# Patient Record
Sex: Female | Born: 1995 | Race: Black or African American | Hispanic: No | Marital: Single | Smoking: Never smoker
Health system: Southern US, Community
[De-identification: ages and names within clinical notes are randomized; demographics above are authoritative.]

## PROBLEM LIST (undated history)

## (undated) DIAGNOSIS — O039 Complete or unspecified spontaneous abortion without complication: Secondary | ICD-10-CM

## (undated) DIAGNOSIS — J45909 Unspecified asthma, uncomplicated: Secondary | ICD-10-CM

---

## 2015-01-03 ENCOUNTER — Encounter (HOSPITAL_BASED_OUTPATIENT_CLINIC_OR_DEPARTMENT_OTHER): Payer: Self-pay | Admitting: *Deleted

## 2015-01-03 ENCOUNTER — Emergency Department (HOSPITAL_BASED_OUTPATIENT_CLINIC_OR_DEPARTMENT_OTHER)
Admission: EM | Admit: 2015-01-03 | Discharge: 2015-01-03 | Disposition: A | Discharge status: Home / Self Care | Payer: Medicaid Other | Attending: Emergency Medicine | Admitting: Emergency Medicine

## 2015-01-03 DIAGNOSIS — S3992XA Unspecified injury of lower back, initial encounter: Secondary | ICD-10-CM | POA: Diagnosis present

## 2015-01-03 DIAGNOSIS — Y998 Other external cause status: Secondary | ICD-10-CM | POA: Insufficient documentation

## 2015-01-03 DIAGNOSIS — Y9389 Activity, other specified: Secondary | ICD-10-CM | POA: Diagnosis not present

## 2015-01-03 DIAGNOSIS — J45909 Unspecified asthma, uncomplicated: Secondary | ICD-10-CM | POA: Diagnosis not present

## 2015-01-03 DIAGNOSIS — Y92410 Unspecified street and highway as the place of occurrence of the external cause: Secondary | ICD-10-CM | POA: Insufficient documentation

## 2015-01-03 HISTORY — DX: Unspecified asthma, uncomplicated: J45.909

## 2015-01-03 MED ORDER — ACETAMINOPHEN 500 MG PO TABS
1000.0000 mg | ORAL_TABLET | Freq: Once | ORAL | Status: AC
  Administered 2015-01-03: 1000 mg via ORAL
  Filled 2015-01-03: qty 2
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
## 2018-05-29 ENCOUNTER — Encounter (HOSPITAL_BASED_OUTPATIENT_CLINIC_OR_DEPARTMENT_OTHER): Payer: Self-pay | Admitting: Emergency Medicine

## 2018-05-29 ENCOUNTER — Emergency Department (HOSPITAL_BASED_OUTPATIENT_CLINIC_OR_DEPARTMENT_OTHER): Payer: Managed Care, Other (non HMO)

## 2018-05-29 ENCOUNTER — Other Ambulatory Visit: Payer: Self-pay

## 2018-05-29 ENCOUNTER — Emergency Department (HOSPITAL_BASED_OUTPATIENT_CLINIC_OR_DEPARTMENT_OTHER)
Admission: EM | Admit: 2018-05-29 | Discharge: 2018-05-29 | Disposition: A | Discharge status: Home / Self Care | Payer: Managed Care, Other (non HMO) | Attending: Emergency Medicine | Admitting: Emergency Medicine

## 2018-05-29 DIAGNOSIS — F17210 Nicotine dependence, cigarettes, uncomplicated: Secondary | ICD-10-CM | POA: Diagnosis not present

## 2018-05-29 DIAGNOSIS — R0789 Other chest pain: Secondary | ICD-10-CM

## 2018-05-29 DIAGNOSIS — J45909 Unspecified asthma, uncomplicated: Secondary | ICD-10-CM | POA: Insufficient documentation

## 2018-05-29 DIAGNOSIS — R079 Chest pain, unspecified: Secondary | ICD-10-CM | POA: Diagnosis present

## 2018-05-29 LAB — CBC
HEMATOCRIT: 40.2 % (ref 36.0–46.0)
Hemoglobin: 12.1 g/dL (ref 12.0–15.0)
MCH: 21.9 pg — AB (ref 26.0–34.0)
MCHC: 30.1 g/dL (ref 30.0–36.0)
MCV: 72.7 fL — AB (ref 80.0–100.0)
Platelets: 368 10*3/uL (ref 150–400)
RBC: 5.53 MIL/uL — ABNORMAL HIGH (ref 3.87–5.11)
RDW: 14.6 % (ref 11.5–15.5)
WBC: 4.6 10*3/uL (ref 4.0–10.5)
nRBC: 0 % (ref 0.0–0.2)

## 2018-05-29 LAB — BASIC METABOLIC PANEL
ANION GAP: 6 (ref 5–15)
BUN: 10 mg/dL (ref 6–20)
CALCIUM: 9 mg/dL (ref 8.9–10.3)
CO2: 24 mmol/L (ref 22–32)
CREATININE: 0.61 mg/dL (ref 0.44–1.00)
Chloride: 105 mmol/L (ref 98–111)
GFR calc Af Amer: >60 mL/min (ref >60)
Glucose, Bld: 92 mg/dL (ref 70–99)
Potassium: 3.7 mmol/L (ref 3.5–5.1)
Sodium: 135 mmol/L (ref 135–145)

## 2018-05-29 LAB — TROPONIN I: Troponin I: <0.03 ng/mL (ref <0.03)

## 2018-05-29 LAB — PREGNANCY, URINE: PREG TEST UR: NEGATIVE

## 2018-05-29 MED ORDER — IBUPROFEN 800 MG PO TABS
800.0000 mg | ORAL_TABLET | Freq: Once | ORAL | Status: AC
  Administered 2018-05-29: 800 mg via ORAL
  Filled 2018-05-29: qty 1

## 2018-05-29 NOTE — ED Triage Notes (Addendum)
Reports chest pain since last night. C/o shortness of breath, denies nausea.

## 2018-05-29 NOTE — ED Notes (Signed)
Pt verbalized understanding of dc instructions.

## 2018-05-29 NOTE — Discharge Instructions (Addendum)
°  Please take ibuprofen for chest wall pain.   Please call [place] [facility] to establish with a primary care doctor.    [address] [place] , [place]   Main [place]: [phone]

## 2018-05-29 NOTE — ED Provider Notes (Signed)
[facility] EMERGENCY DEPARTMENT Provider Note   CSN: [id number] Arrival date & time: 05/29/18  1012  History   Chief Complaint Chief Complaint  Patient presents with  . Chest Pain    HPI Anna Bright is a 22 y.o. female here with chest pain since last night. She reports it is a sharp pain and localizes it to the center of her chest. She felt short of breath last night but that has resolved. She took some tylenol with little relief. She felt unable to go to work last night. She also endorses anxiety. She reports "I had my first anxiety attack the other night for no reason". She denies SI/HI.   HPI  Past Medical History:  Diagnosis Date  . Asthma     There are no active problems to display for this patient.   History reviewed. No pertinent surgical history.   OB History   None      Home Medications    Prior to Admission medications   Not on File    Family History History reviewed. No pertinent family history.  Social History Social History   Tobacco Use  . Smoking status: Current Every Day Smoker    Types: Cigars  . Smokeless tobacco: Never Used  Substance Use Topics  . Alcohol use: No  . Drug use: No     Allergies   Patient has no known allergies.   Review of Systems Review of Systems  Constitutional: Negative for activity change, appetite change, chills and fever.  HENT: Negative for congestion, sinus pain and sore throat.   Eyes: Negative for photophobia and visual disturbance.  Respiratory: Negative for cough, shortness of breath and wheezing.   Cardiovascular: Positive for chest pain. Negative for palpitations.  Gastrointestinal: Negative for diarrhea, nausea and vomiting.  Genitourinary: Negative for dysuria.  Musculoskeletal: Negative for myalgias.  Skin: Negative for rash.  Neurological: Negative for weakness and headaches.  Psychiatric/Behavioral: The patient is nervous/anxious.      Physical Exam Updated Vital Signs BP  118/76 (BP Location: Left Arm)   Pulse 75   Temp 98.2 F (36.8 C) (Oral)   Resp 20   Ht 5\' 1"  (1.549 m)   Wt 72.6 kg   LMP 05/29/2018   SpO2 100%   BMI 30.23 kg/m   Physical Exam  Constitutional: She is oriented to person, place, and time. She appears well-developed and well-nourished.  Non-toxic appearance. She does not appear ill.  HENT:  Head: Normocephalic and atraumatic.  Eyes: Pupils are equal, round, and reactive to light. EOM are normal.  Neck: Normal range of motion. Neck supple.  Cardiovascular: Normal rate, regular rhythm and normal pulses.  Pulmonary/Chest: Effort normal and breath sounds normal. She exhibits tenderness.    Abdominal: Soft. There is no tenderness. There is no rebound and no guarding.  Musculoskeletal: Normal range of motion.  Neurological: She is alert and oriented to person, place, and time.  Skin: Skin is warm and dry. Capillary refill takes less than 2 seconds.  Psychiatric: Her mood appears anxious.  Nursing note and vitals reviewed.    ED Treatments / Results  Labs (all labs ordered are listed, but only abnormal results are displayed) Labs Reviewed  CBC - Abnormal; Notable for the following components:      Result Value   RBC 5.53 (*)    MCV 72.7 (*)    MCH 21.9 (*)    All other components within normal limits  BASIC METABOLIC PANEL  TROPONIN I  PREGNANCY, URINE    EKG None  Radiology Dg Chest 2 View  Result Date: 05/29/2018 CLINICAL DATA:  Chest pain. EXAM: CHEST - 2 VIEW COMPARISON:  None. FINDINGS: The heart size and mediastinal contours are within normal limits. Both lungs are clear. No pneumothorax or pleural effusion is noted. The visualized skeletal structures are unremarkable. IMPRESSION: No active cardiopulmonary disease. Electronically Signed   By: Lupita RaiderJames  Green Jr, M.D.   On: 05/29/2018 10:43    Procedures Procedures (including critical care time)  Medications Ordered in ED Medications  ibuprofen (ADVIL,MOTRIN)  tablet 800 mg (has no administration in time range)     Initial Impression / Assessment and Plan / ED Course  I have reviewed the triage vital signs and the nursing notes.  Pertinent labs & imaging results that were available during my care of the patient were reviewed by me and considered in my medical decision making (see chart for details).     Well appearing 22 year old with chest wall pain that began last night. Afebrile, normal vital signs. CXR negative. EKG NSR. CBC and BMP normal. Negative troponin. Pain is reproducible on exam. Patient also endorses anxiety. Given 800 mg ibuprofen. Reassurance provided. Will give information for her to establish with PCP to discuss anxiety. Stable for discharge home. Patient verbalized understanding and agreement with plan.   Final Clinical Impressions(s) / ED Diagnoses   Final diagnoses:  Chest wall pain    ED Discharge Orders    None      Dolores PattyAngela Mercades Bajaj, DO PGY-3, [facility] 05/29/2018 11:42 AM    Tillman Sersiccio, Talene Glastetter C, DO 05/29/18 1155    Little, Ambrose Finlandachel Morgan, MD 06/01/18 [phone]

## 2018-06-29 ENCOUNTER — Emergency Department (HOSPITAL_BASED_OUTPATIENT_CLINIC_OR_DEPARTMENT_OTHER)
Admission: EM | Admit: 2018-06-29 | Discharge: 2018-06-29 | Disposition: A | Discharge status: Home / Self Care | Payer: Managed Care, Other (non HMO) | Attending: Emergency Medicine | Admitting: Emergency Medicine

## 2018-06-29 ENCOUNTER — Other Ambulatory Visit: Payer: Self-pay

## 2018-06-29 ENCOUNTER — Emergency Department (HOSPITAL_BASED_OUTPATIENT_CLINIC_OR_DEPARTMENT_OTHER): Payer: Managed Care, Other (non HMO)

## 2018-06-29 ENCOUNTER — Encounter (HOSPITAL_BASED_OUTPATIENT_CLINIC_OR_DEPARTMENT_OTHER): Payer: Self-pay | Admitting: Emergency Medicine

## 2018-06-29 DIAGNOSIS — R1011 Right upper quadrant pain: Secondary | ICD-10-CM | POA: Diagnosis present

## 2018-06-29 DIAGNOSIS — J45909 Unspecified asthma, uncomplicated: Secondary | ICD-10-CM | POA: Insufficient documentation

## 2018-06-29 DIAGNOSIS — K8020 Calculus of gallbladder without cholecystitis without obstruction: Secondary | ICD-10-CM | POA: Insufficient documentation

## 2018-06-29 DIAGNOSIS — F17290 Nicotine dependence, other tobacco product, uncomplicated: Secondary | ICD-10-CM | POA: Insufficient documentation

## 2018-06-29 DIAGNOSIS — R109 Unspecified abdominal pain: Secondary | ICD-10-CM

## 2018-06-29 LAB — CBC
HCT: 37.9 % (ref 36.0–46.0)
Hemoglobin: 11.5 g/dL — ABNORMAL LOW (ref 12.0–15.0)
MCH: 22.2 pg — AB (ref 26.0–34.0)
MCHC: 30.3 g/dL (ref 30.0–36.0)
MCV: 73 fL — ABNORMAL LOW (ref 80.0–100.0)
Platelets: 318 10*3/uL (ref 150–400)
RBC: 5.19 MIL/uL — ABNORMAL HIGH (ref 3.87–5.11)
RDW: 14.6 % (ref 11.5–15.5)
WBC: 5.5 10*3/uL (ref 4.0–10.5)
nRBC: 0 % (ref 0.0–0.2)

## 2018-06-29 LAB — URINALYSIS, MICROSCOPIC (REFLEX): WBC, UA: NONE SEEN WBC/hpf (ref 0–5)

## 2018-06-29 LAB — COMPREHENSIVE METABOLIC PANEL
ALK PHOS: 38 U/L (ref 38–126)
ALT: 17 U/L (ref 0–44)
AST: 30 U/L (ref 15–41)
Albumin: 3.4 g/dL — ABNORMAL LOW (ref 3.5–5.0)
Anion gap: 7 (ref 5–15)
BUN: 9 mg/dL (ref 6–20)
CALCIUM: 8.8 mg/dL — AB (ref 8.9–10.3)
CO2: 22 mmol/L (ref 22–32)
CREATININE: 0.53 mg/dL (ref 0.44–1.00)
Chloride: 106 mmol/L (ref 98–111)
GFR calc Af Amer: >60 mL/min (ref >60)
Glucose, Bld: 102 mg/dL — ABNORMAL HIGH (ref 70–99)
Potassium: 3.3 mmol/L — ABNORMAL LOW (ref 3.5–5.1)
Sodium: 135 mmol/L (ref 135–145)
TOTAL PROTEIN: 7.4 g/dL (ref 6.5–8.1)
Total Bilirubin: 0.4 mg/dL (ref 0.3–1.2)

## 2018-06-29 LAB — URINALYSIS, ROUTINE W REFLEX MICROSCOPIC
BILIRUBIN URINE: NEGATIVE
Glucose, UA: NEGATIVE mg/dL
Ketones, ur: NEGATIVE mg/dL
Leukocytes, UA: NEGATIVE
Nitrite: NEGATIVE
PROTEIN: NEGATIVE mg/dL
Specific Gravity, Urine: >1.03 — ABNORMAL HIGH (ref 1.005–1.030)
pH: 5.5 (ref 5.0–8.0)

## 2018-06-29 LAB — LIPASE, BLOOD: Lipase: 26 U/L (ref 11–51)

## 2018-06-29 LAB — PREGNANCY, URINE: PREG TEST UR: NEGATIVE

## 2018-06-29 MED ORDER — ONDANSETRON HCL 4 MG/2ML IJ SOLN
4.0000 mg | Freq: Once | INTRAMUSCULAR | Status: AC
  Administered 2018-06-29: 4 mg via INTRAVENOUS
  Filled 2018-06-29: qty 2

## 2018-06-29 MED ORDER — ONDANSETRON 4 MG PO TBDP: 4.0000 mg | ORAL_TABLET | Freq: Three times a day (TID) | ORAL | 0 refills | Status: DC | PRN

## 2018-06-29 MED ORDER — OXYCODONE-ACETAMINOPHEN 5-325 MG PO TABS: 1.0000 | ORAL_TABLET | ORAL | 0 refills | Status: AC | PRN

## 2018-06-29 MED ORDER — OXYCODONE-ACETAMINOPHEN 5-325 MG PO TABS
2.0000 | ORAL_TABLET | Freq: Once | ORAL | Status: AC
  Administered 2018-06-29: 2 via ORAL
  Filled 2018-06-29: qty 2

## 2018-06-29 MED ORDER — MORPHINE SULFATE (PF) 4 MG/ML IV SOLN
4.0000 mg | Freq: Once | INTRAVENOUS | Status: AC
  Administered 2018-06-29: 4 mg via INTRAVENOUS
  Filled 2018-06-29: qty 1

## 2018-06-29 NOTE — Discharge Instructions (Addendum)
EAT SMALL PORTIONS OF FOOD AND AVOID GREASY OR FATTY FOODS. A LOW FAT DIET IS VERY IMPORTANT TO AVOID GALLBLADDER PAIN. CONTACT THE SURGERY CLINIC FOR A NEXT-AVAILABLE APPOINTMENT. IF YOUR PAIN AND VOMITING COMES BACK AND GETS WORSE, OR IF YOU DEVELOP FEVER, GO DIRECTLY TO [place] OR [place] ER.
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
## 2018-06-29 NOTE — ED Notes (Signed)
PO ginger ale given

## 2018-06-29 NOTE — ED Provider Notes (Signed)
[facility] EMERGENCY DEPARTMENT Provider Note   CSN: [id number] Arrival date & time: 06/29/18  1730     History   Chief Complaint Chief Complaint  Patient presents with  . Abdominal Pain    HPI Anna Bright is a 23 y.o. female.  23yo F w/ h/o asthma who p/w abdominal pain. Pt states that she has intermittently had problems with right upper abdominal pain especially after she eats but the pain has always gone away.  This morning she began having severe right upper abdominal pain that radiates to her back.  She took Tylenol and it resolved.  This evening she ate a bowl of cereal and then the abdominal pain returned.  Currently it is severe, squeezing, and constant.  She denies any hematuria but notes that she is currently menstruating.  No vaginal discharge.  She usually has no nausea or vomiting with this pain but tonight she had one episode of vomiting just prior to arrival.  No diarrhea or blood in her stool.  No URI symptoms or fevers.  The history is provided by the patient.  Abdominal Pain    Past Medical History:  Diagnosis Date  . Asthma     There are no active problems to display for this patient.   History reviewed. No pertinent surgical history.   OB History   No obstetric history on file.      Home Medications    Prior to Admission medications   Medication Sig Start Date End Date Taking? Authorizing Provider  ondansetron (ZOFRAN ODT) 4 MG disintegrating tablet Take 1 tablet (4 mg total) by mouth every 8 (eight) hours as needed for nausea or vomiting. 06/29/18   Little, Ambrose Finland, MD  oxyCODONE-acetaminophen (PERCOCET) 5-325 MG tablet Take 1 tablet by mouth every 4 (four) hours as needed for up to 5 days for severe pain. 06/29/18 07/04/18  Little, Ambrose Finland, MD    Family History History reviewed. No pertinent family history.  Social History Social History   Tobacco Use  . Smoking status: Current Every Day Smoker    Types: Cigars  .  Smokeless tobacco: Never Used  Substance Use Topics  . Alcohol use: No  . Drug use: No     Allergies   Patient has no known allergies.   Review of Systems Review of Systems  Gastrointestinal: Positive for abdominal pain.  All other systems reviewed and are negative except that which was mentioned in HPI    Physical Exam Updated Vital Signs BP 120/68 (BP Location: Right Arm)   Pulse 78   Temp 98.5 F (36.9 C) (Oral)   Resp 16   Ht 5\' 1"  (1.549 m)   Wt 68 kg   LMP 06/26/2018   SpO2 100%   BMI 28.34 kg/m   Physical Exam Vitals signs and nursing note reviewed.  Constitutional:      General: She is not in acute distress.    Appearance: She is well-developed.     Comments: uncomfortable  HENT:     Head: Normocephalic and atraumatic.  Eyes:     Conjunctiva/sclera: Conjunctivae normal.     Pupils: Pupils are equal, round, and reactive to light.  Neck:     Musculoskeletal: Neck supple.  Cardiovascular:     Rate and Rhythm: Normal rate and regular rhythm.     Heart sounds: Normal heart sounds. No murmur.  Pulmonary:     Effort: Pulmonary effort is normal.     Breath sounds: Normal breath sounds.  Abdominal:     General: Bowel sounds are normal. There is no distension.     Palpations: Abdomen is soft.     Tenderness: There is abdominal tenderness in the right upper quadrant. There is no guarding or rebound. Positive signs include Murphy's sign.  Skin:    General: Skin is warm and dry.  Neurological:     Mental Status: She is alert and oriented to person, place, and time.     Comments: Fluent speech  Psychiatric:        Judgment: Judgment normal.      ED Treatments / Results  Labs (all labs ordered are listed, but only abnormal results are displayed) Labs Reviewed  COMPREHENSIVE METABOLIC PANEL - Abnormal; Notable for the following components:      Result Value   Potassium 3.3 (*)    Glucose, Bld 102 (*)    Calcium 8.8 (*)    Albumin 3.4 (*)    All  other components within normal limits  CBC - Abnormal; Notable for the following components:   RBC 5.19 (*)    Hemoglobin 11.5 (*)    MCV 73.0 (*)    MCH 22.2 (*)    All other components within normal limits  URINALYSIS, ROUTINE W REFLEX MICROSCOPIC - Abnormal; Notable for the following components:   Specific Gravity, Urine >1.030 (*)    Hgb urine dipstick TRACE (*)    All other components within normal limits  URINALYSIS, MICROSCOPIC (REFLEX) - Abnormal; Notable for the following components:   Bacteria, UA RARE (*)    All other components within normal limits  LIPASE, BLOOD  PREGNANCY, URINE    EKG None  Radiology [place] Abdomen Limited Ruq  Result Date: 06/29/2018 CLINICAL DATA:  Acute onset of severe right upper quadrant pain this morning. Vomiting. Positive Murphy's sign. EXAM: ULTRASOUND ABDOMEN LIMITED RIGHT UPPER QUADRANT COMPARISON:  None. FINDINGS: Gallbladder: Multiple tiny less than 1 cm gallstones are seen. No evidence of gallbladder wall thickening or pericholecystic fluid. Common bile duct: Diameter: 5 mm, within normal limits. Liver: No focal lesion identified. Within normal limits in parenchymal echogenicity. Portal vein is patent on color Doppler imaging with normal direction of blood flow towards the liver. IMPRESSION: Cholelithiasis, without definite sonographic findings of cholecystitis or biliary ductal dilatation. Electronically Signed   By: Myles RosenthalJohn  Stahl M.D.   On: 06/29/2018 18:51    Procedures Procedures (including critical care time)  Medications Ordered in ED Medications  ondansetron (ZOFRAN) injection 4 mg (4 mg Intravenous Given 06/29/18 1756)  morphine 4 MG/ML injection 4 mg (4 mg Intravenous Given 06/29/18 1756)  oxyCODONE-acetaminophen (PERCOCET/ROXICET) 5-325 MG per tablet 2 tablet (2 tablets Oral Given 06/29/18 2013)     Initial Impression / Assessment and Plan / ED Course  I have reviewed the triage vital signs and the nursing notes.  Pertinent labs  & imaging results that were available during my care of the patient were reviewed by me and considered in my medical decision making (see chart for details).    Non-toxic on exam, reassuring VS. No RLQ tenderness on exam but she did have RUQ pain which raises suspicion for gallbladder pathology. Obtained RUQ US and labs.   WBC normal, Hgb 11.5, LFTS and bili normal, lipase normal.   US shows cholelithiasis without evidence of cholecystitis. Pt improved after percocet and drinking liquids on reassessment.  She wants to go home. She understands importance of f/u with gen surgery and I have extensively reviewed return precautions. I have  instructed her to report to [facility] or [facility] ED if her symptoms worsen or she fails outpatient management. Counseled on dietary changes.  Final Clinical Impressions(s) / ED Diagnoses   Final diagnoses:  Symptomatic cholelithiasis    ED Discharge Orders         Ordered    oxyCODONE-acetaminophen (PERCOCET) 5-325 MG tablet  Every 4 hours PRN     06/29/18 2126    ondansetron (ZOFRAN ODT) 4 MG disintegrating tablet  Every 8 hours PRN     06/29/18 2126           Little, Ambrose Finland, MD 06/30/18 [phone]

## 2018-06-29 NOTE — ED Triage Notes (Signed)
Reports RLQ abdominal and right flank pain which began this morning.  Denies dysuria.  Reports 1 episode of vomiting.

## 2019-03-13 ENCOUNTER — Emergency Department (HOSPITAL_BASED_OUTPATIENT_CLINIC_OR_DEPARTMENT_OTHER)
Admission: EM | Admit: 2019-03-13 | Discharge: 2019-03-13 | Disposition: A | Discharge status: Home / Self Care | Payer: Managed Care, Other (non HMO) | Attending: Emergency Medicine | Admitting: Emergency Medicine

## 2019-03-13 ENCOUNTER — Encounter (HOSPITAL_BASED_OUTPATIENT_CLINIC_OR_DEPARTMENT_OTHER): Payer: Self-pay | Admitting: Emergency Medicine

## 2019-03-13 ENCOUNTER — Other Ambulatory Visit: Payer: Self-pay

## 2019-03-13 ENCOUNTER — Emergency Department (HOSPITAL_BASED_OUTPATIENT_CLINIC_OR_DEPARTMENT_OTHER): Payer: Managed Care, Other (non HMO)

## 2019-03-13 DIAGNOSIS — K8050 Calculus of bile duct without cholangitis or cholecystitis without obstruction: Secondary | ICD-10-CM | POA: Insufficient documentation

## 2019-03-13 DIAGNOSIS — R0789 Other chest pain: Secondary | ICD-10-CM | POA: Diagnosis present

## 2019-03-13 DIAGNOSIS — J45909 Unspecified asthma, uncomplicated: Secondary | ICD-10-CM | POA: Insufficient documentation

## 2019-03-13 DIAGNOSIS — R1011 Right upper quadrant pain: Secondary | ICD-10-CM

## 2019-03-13 LAB — CBC WITH DIFFERENTIAL/PLATELET
Abs Immature Granulocytes: 0.01 10*3/uL (ref 0.00–0.07)
Basophils Absolute: 0 10*3/uL (ref 0.0–0.1)
Basophils Relative: 0 %
Eosinophils Absolute: 0.1 10*3/uL (ref 0.0–0.5)
Eosinophils Relative: 2 %
HCT: 36.8 % (ref 36.0–46.0)
Hemoglobin: 11.2 g/dL — ABNORMAL LOW (ref 12.0–15.0)
Immature Granulocytes: 0 %
Lymphocytes Relative: 38 %
Lymphs Abs: 2.2 10*3/uL (ref 0.7–4.0)
MCH: 21.9 pg — ABNORMAL LOW (ref 26.0–34.0)
MCHC: 30.4 g/dL (ref 30.0–36.0)
MCV: 72 fL — ABNORMAL LOW (ref 80.0–100.0)
Monocytes Absolute: 0.4 10*3/uL (ref 0.1–1.0)
Monocytes Relative: 7 %
Neutro Abs: 3.1 10*3/uL (ref 1.7–7.7)
Neutrophils Relative %: 53 %
Platelets: 336 10*3/uL (ref 150–400)
RBC: 5.11 MIL/uL (ref 3.87–5.11)
RDW: 15.4 % (ref 11.5–15.5)
WBC: 5.8 10*3/uL (ref 4.0–10.5)
nRBC: 0 % (ref 0.0–0.2)

## 2019-03-13 LAB — COMPREHENSIVE METABOLIC PANEL
ALT: 27 U/L (ref 0–44)
AST: 28 U/L (ref 15–41)
Albumin: 3.3 g/dL — ABNORMAL LOW (ref 3.5–5.0)
Alkaline Phosphatase: 50 U/L (ref 38–126)
Anion gap: 8 (ref 5–15)
BUN: 13 mg/dL (ref 6–20)
CO2: 23 mmol/L (ref 22–32)
Calcium: 8.6 mg/dL — ABNORMAL LOW (ref 8.9–10.3)
Chloride: 105 mmol/L (ref 98–111)
Creatinine, Ser: 0.56 mg/dL (ref 0.44–1.00)
GFR calc Af Amer: >60 mL/min (ref >60)
GFR calc non Af Amer: >60 mL/min (ref >60)
Glucose, Bld: 98 mg/dL (ref 70–99)
Potassium: 4.1 mmol/L (ref 3.5–5.1)
Sodium: 136 mmol/L (ref 135–145)
Total Bilirubin: 0.5 mg/dL (ref 0.3–1.2)
Total Protein: 7.1 g/dL (ref 6.5–8.1)

## 2019-03-13 MED ORDER — KETOROLAC TROMETHAMINE 15 MG/ML IJ SOLN
30.0000 mg | Freq: Once | INTRAMUSCULAR | Status: AC
  Administered 2019-03-13: 30 mg via INTRAVENOUS
  Filled 2019-03-13: qty 2

## 2019-03-13 NOTE — Discharge Instructions (Signed)
You were seen in the emergency department for right-sided chest pain.  You had blood work EKG and a ultrasound.  This pain is likely due to your gallstones and he will need to follow-up with a general surgeon to discuss possible surgery.  You should use Tylenol and ibuprofen for pain.  Avoid fatty foods.  Return if any fever or unrelenting pain.

## 2019-03-13 NOTE — ED Notes (Signed)
US at bedside
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
## 2019-03-13 NOTE — ED Provider Notes (Signed)
[facility] EMERGENCY DEPARTMENT Provider Note   CSN: [id number] Arrival date & time: 03/13/19  0488     History   Chief Complaint Chief Complaint  Patient presents with  . Chest Pain    HPI Anna Bright is a 23 y.o. female.  23 year old female with right-sided chest pain that started around 2 AM waking her up.  She feels it under her right breast and it feels tight.  Moderate intensity.  She said she has had this discomfort on and off for a few years and nobody is ever figured out what it was.  No prior surgical history.  When I asked her if anybody ever tested her for gallstone she said yes that is what they told her the last time.     The history is provided by the patient.  Chest Pain Pain location:  R chest Pain quality: tightness   Pain radiates to:  Does not radiate Pain severity:  Moderate Onset quality:  Sudden Duration:  7 hours Timing:  Constant Progression:  Unchanged Chronicity:  Recurrent Relieved by:  None tried Worsened by:  Nothing Ineffective treatments:  None tried Associated symptoms: nausea and vomiting   Associated symptoms: no abdominal pain, no cough, no fever, no headache, no heartburn and no shortness of breath   Risk factors: no coronary artery disease, no diabetes mellitus, no high cholesterol, no hypertension and not pregnant     Past Medical History:  Diagnosis Date  . Asthma     There are no active problems to display for this patient.   History reviewed. No pertinent surgical history.   OB History   No obstetric history on file.      Home Medications    Prior to Admission medications   Medication Sig Start Date End Date Taking? Authorizing Provider  ondansetron (ZOFRAN ODT) 4 MG disintegrating tablet Take 1 tablet (4 mg total) by mouth every 8 (eight) hours as needed for nausea or vomiting. 06/29/18   Little, Ambrose Finland, MD    Family History No family history on file.  Social History Social History    Tobacco Use  . Smoking status: Never Smoker  . Smokeless tobacco: Never Used  Substance Use Topics  . Alcohol use: No  . Drug use: No     Allergies   Patient has no known allergies.   Review of Systems Review of Systems  Constitutional: Negative for fever.  HENT: Negative for sore throat.   Eyes: Negative for visual disturbance.  Respiratory: Negative for cough and shortness of breath.   Cardiovascular: Positive for chest pain.  Gastrointestinal: Positive for nausea and vomiting. Negative for abdominal pain and heartburn.  Genitourinary: Negative for dysuria.  Musculoskeletal: Negative for neck pain.  Skin: Negative for rash.  Neurological: Negative for headaches.     Physical Exam Updated Vital Signs BP 118/73   Pulse 74   Temp 98 F (36.7 C) (Oral)   Resp 18   Ht 5\' 1"  (1.549 m)   Wt 78 kg   LMP 03/09/2019   SpO2 99%   BMI 32.50 kg/m   Physical Exam Vitals signs and nursing note reviewed.  Constitutional:      General: She is not in acute distress.    Appearance: She is well-developed.  HENT:     Head: Normocephalic and atraumatic.  Eyes:     Conjunctiva/sclera: Conjunctivae normal.  Neck:     Musculoskeletal: Neck supple.  Cardiovascular:     Rate and Rhythm: Normal  rate and regular rhythm.     Heart sounds: Normal heart sounds. No murmur.  Pulmonary:     Effort: Pulmonary effort is normal. No respiratory distress.     Breath sounds: Normal breath sounds.  Abdominal:     Palpations: Abdomen is soft. There is no mass.     Tenderness: There is no abdominal tenderness. There is no guarding or rebound.  Musculoskeletal: Normal range of motion.     Right lower leg: She exhibits no tenderness.     Left lower leg: She exhibits no tenderness.  Skin:    General: Skin is warm and dry.     Capillary Refill: Capillary refill takes less than 2 seconds.  Neurological:     General: No focal deficit present.     Mental Status: She is alert.      ED  Treatments / Results  Labs (all labs ordered are listed, but only abnormal results are displayed) Labs Reviewed  COMPREHENSIVE METABOLIC PANEL - Abnormal; Notable for the following components:      Result Value   Calcium 8.6 (*)    Albumin 3.3 (*)    All other components within normal limits  CBC WITH DIFFERENTIAL/PLATELET - Abnormal; Notable for the following components:   Hemoglobin 11.2 (*)    MCV 72.0 (*)    MCH 21.9 (*)    All other components within normal limits    EKG EKG Interpretation  Date/Time:  Thursday March 13 2019 09:24:18 EDT Ventricular Rate:  67 PR Interval:    QRS Duration: 64 QT Interval:  408 QTC Calculation: 431 R Axis:   69 Text Interpretation:  Sinus rhythm similar to prior 12/19 Confirmed by Meridee ScoreButler, Mikael Skoda [id number]) on 03/13/2019 9:40:45 AM   Radiology [place] Abdomen Limited Ruq  Result Date: 03/13/2019 CLINICAL DATA:  Upper abdominal pain with nausea and vomiting EXAM: ULTRASOUND ABDOMEN LIMITED RIGHT UPPER QUADRANT COMPARISON:  June 29, 2018 FINDINGS: Within the gallbladder, there are echogenic foci which move and shadow consistent with cholelithiasis. Largest gallstone measures 5 mm. There is also a 3 mm echogenic focus which does not move or shadow which may represent a small polyp. There is no gallbladder wall thickening or pericholecystic fluid. No sonographic Murphy sign noted by sonographer. Common bile duct: Diameter: 4 mm. No intrahepatic or extrahepatic biliary duct dilatation. Liver: No focal lesion identified. Within normal limits in parenchymal echogenicity. Portal vein is patent on color Doppler imaging with normal direction of blood flow towards the liver. Other: None. IMPRESSION: Cholelithiasis. Probable 3 mm polyp as well in the gallbladder. No gallbladder wall thickening or pericholecystic fluid. Study otherwise unremarkable. Electronically Signed   By: Bretta BangWilliam  Woodruff III M.D.   On: 03/13/2019 11:12    Procedures Procedures  (including critical care time)  Medications Ordered in ED Medications  ketorolac (TORADOL) 15 MG/ML injection 30 mg (30 mg Intravenous Given 03/13/19 1001)     Initial Impression / Assessment and Plan / ED Course  I have reviewed the triage vital signs and the nursing notes.  Pertinent labs & imaging results that were available during my care of the patient were reviewed by me and considered in my medical decision making (see chart for details).  Clinical Course as of Mar 12 1646  Thu Mar 13, 2019  [id number] 23 year old female with right-sided chest pain.  Differential includes biliary colic, ACS, pneumonia, pneumothorax, GERD.  She had a work-up in January which showed gallstones.   [MB]  1139 Reevaluated patient and she feels  better.  Work-up is been unremarkable including a normal white count normal LFTs.  Right upper quadrant ultrasound shows cholelithiasis but no evidence of cholecystitis.  We talked about the need for her to follow-up with general surgery and indications for return if she would experience a fever or unrelenting pain.   [MB]    Clinical Course User Index [MB] Hayden Rasmussen, MD        Final Clinical Impressions(s) / ED Diagnoses   Final diagnoses:  RUQ pain  Biliary colic    ED Discharge Orders    None       Hayden Rasmussen, MD 03/13/19 [phone]

## 2020-01-29 ENCOUNTER — Encounter (HOSPITAL_BASED_OUTPATIENT_CLINIC_OR_DEPARTMENT_OTHER): Payer: Self-pay | Admitting: Emergency Medicine

## 2020-01-29 ENCOUNTER — Other Ambulatory Visit: Payer: Self-pay

## 2020-01-29 ENCOUNTER — Emergency Department (HOSPITAL_BASED_OUTPATIENT_CLINIC_OR_DEPARTMENT_OTHER)
Admission: EM | Admit: 2020-01-29 | Discharge: 2020-01-29 | Disposition: A | Discharge status: Home / Self Care | Payer: Managed Care, Other (non HMO) | Attending: Emergency Medicine | Admitting: Emergency Medicine

## 2020-01-29 DIAGNOSIS — B349 Viral infection, unspecified: Secondary | ICD-10-CM | POA: Diagnosis not present

## 2020-01-29 DIAGNOSIS — U071 COVID-19: Secondary | ICD-10-CM | POA: Diagnosis not present

## 2020-01-29 DIAGNOSIS — J45909 Unspecified asthma, uncomplicated: Secondary | ICD-10-CM | POA: Insufficient documentation

## 2020-01-29 DIAGNOSIS — R05 Cough: Secondary | ICD-10-CM | POA: Diagnosis present

## 2020-01-29 DIAGNOSIS — Z20822 Contact with and (suspected) exposure to covid-19: Secondary | ICD-10-CM

## 2020-01-29 LAB — SARS CORONAVIRUS 2 BY RT PCR (HOSPITAL ORDER, PERFORMED IN ~~LOC~~ HOSPITAL LAB): SARS Coronavirus 2: POSITIVE — AB

## 2020-01-29 MED ORDER — ONDANSETRON 4 MG PO TBDP: 4.0000 mg | ORAL_TABLET | ORAL | 0 refills | Status: DC | PRN

## 2020-01-29 MED ORDER — IBUPROFEN 800 MG PO TABS: 800.0000 mg | ORAL_TABLET | Freq: Three times a day (TID) | ORAL | 0 refills | Status: DC

## 2020-01-29 NOTE — ED Triage Notes (Signed)
Cough, weakness, diarrhea x 2 days.

## 2020-01-29 NOTE — Discharge Instructions (Signed)
1.  Isolate and follow instructions regarding COVID-19 until your test results are returned. 2.  You may take ibuprofen and Zofran as needed for body aches or nausea. 3.  Schedule a follow-up appointment with your doctor for recheck in the next 3 to 5 days. 4.  Return to the emergency department if you are getting significant shortness of breath, weakness, worsening or concerning symptoms.  If you have Covid, you may experience headaches, significant amount of coughing, vomiting, diarrhea, body aches.

## 2020-01-29 NOTE — ED Notes (Signed)
ED Provider at bedside. 

## 2020-01-29 NOTE — ED Provider Notes (Signed)
[facility] EMERGENCY DEPARTMENT Provider Note   CSN: [id number] Arrival date & time: 01/29/20  0815     History Chief Complaint  Patient presents with  . COVID symptoms    Anna Bright is a 24 y.o. female.  HPI Patient is being seen with her 28-year-old daughter for symptoms of fever.  The patient experienced body aches and cough starting 3 days ago.  She has not had a documented fever.  She has not had a headache.  No significant nasal congestion or sore throat.  No chest pain.  No vomiting or diarrhea.  No pain burning urgency with urination.  She has concern for possible Covid.  She has not been immunized.  Her daughter was sick 2 days before she herself became sick.  She mostly presents for concerns of getting her daughter further evaluated.    Past Medical History:  Diagnosis Date  . Asthma     There are no problems to display for this patient.   History reviewed. No pertinent surgical history.   OB History   No obstetric history on file.     No family history on file.  Social History   Tobacco Use  . Smoking status: Never Smoker  . Smokeless tobacco: Never Used  Substance Use Topics  . Alcohol use: Yes  . Drug use: No    Home Medications Prior to Admission medications   Medication Sig Start Date End Date Taking? Authorizing Provider  ibuprofen (ADVIL) 800 MG tablet Take 1 tablet (800 mg total) by mouth 3 (three) times daily. 01/29/20   Arby Barrette, MD  ondansetron (ZOFRAN ODT) 4 MG disintegrating tablet Take 1 tablet (4 mg total) by mouth every 8 (eight) hours as needed for nausea or vomiting. 06/29/18   Little, Ambrose Finland, MD  ondansetron (ZOFRAN ODT) 4 MG disintegrating tablet Take 1 tablet (4 mg total) by mouth every 4 (four) hours as needed for nausea or vomiting. 01/29/20   Arby Barrette, MD    Allergies    Patient has no known allergies.  Review of Systems   Review of Systems 10 systems reviewed and negative except as per  HPI Physical Exam Updated Vital Signs BP 112/63 (BP Location: Right Arm)   Pulse 90   Temp 98.9 F (37.2 C) (Oral)   Resp 16   Ht 5\' 1"  (1.549 m)   Wt 81.2 kg   LMP 12/30/2019   SpO2 99%   BMI 33.82 kg/m   Physical Exam Constitutional:      Appearance: She is well-developed.  HENT:     Head: Normocephalic and atraumatic.     Mouth/Throat:     Mouth: Mucous membranes are moist.     Pharynx: Oropharynx is clear.  Cardiovascular:     Rate and Rhythm: Normal rate and regular rhythm.     Heart sounds: Normal heart sounds.  Pulmonary:     Effort: Pulmonary effort is normal.     Breath sounds: Normal breath sounds.  Abdominal:     General: Bowel sounds are normal. There is no distension.     Palpations: Abdomen is soft.     Tenderness: There is no abdominal tenderness.  Musculoskeletal:        General: Normal range of motion.     Cervical back: Neck supple.  Skin:    General: Skin is warm and dry.  Neurological:     Mental Status: She is alert and oriented to person, place, and time.  GCS: GCS eye subscore is 4. GCS verbal subscore is 5. GCS motor subscore is 6.     Coordination: Coordination normal.  Psychiatric:        Mood and Affect: Mood normal.     ED Results / Procedures / Treatments   Labs (all labs ordered are listed, but only abnormal results are displayed) Labs Reviewed  SARS CORONAVIRUS 2 (TAT 6-24 HRS)  SARS CORONAVIRUS 2 BY RT PCR (HOSPITAL ORDER, PERFORMED IN [facility])    EKG None  Radiology No results found.  Procedures Procedures (including critical care time)  Medications Ordered in ED Medications - No data to display  ED Course  I have reviewed the triage vital signs and the nursing notes.  Pertinent labs & imaging results that were available during my care of the patient were reviewed by me and considered in my medical decision making (see chart for details).    MDM Rules/Calculators/A&P                           Patient has had cough and body aches.  She is otherwise clinically well.  Vital signs are stable.  No hypoxia.  Patient is afebrile at this time.  She does have concern for possible Covid given her daughter is also sick.  Patient has not been vaccinated.  We will proceed with testing.  Patient is counseled on symptomatic management of viral illness with ibuprofen and Zofran if needed.  Return precautions reviewed.  Sueann Channing was evaluated in Emergency Department on 01/29/2020 for the symptoms described in the history of present illness. She was evaluated in the context of the global COVID-19 pandemic, which necessitated consideration that the patient might be at risk for infection with the SARS-CoV-2 virus that causes COVID-19. Institutional protocols and algorithms that pertain to the evaluation of patients at risk for COVID-19 are in a state of rapid change based on information released by regulatory bodies including the CDC and federal and state organizations. These policies and algorithms were followed during the patient's care in the ED. Final Clinical Impression(s) / ED Diagnoses Final diagnoses:  Viral syndrome  Encounter for laboratory testing for COVID-19 virus    Rx / DC Orders ED Discharge Orders         Ordered    ibuprofen (ADVIL) 800 MG tablet  3 times daily     Discontinue  Reprint     01/29/20 1047    ondansetron (ZOFRAN ODT) 4 MG disintegrating tablet  Every 4 hours PRN     Discontinue  Reprint     01/29/20 1047           Arby Barrette, MD 01/29/20 1052

## 2020-02-02 ENCOUNTER — Emergency Department (HOSPITAL_BASED_OUTPATIENT_CLINIC_OR_DEPARTMENT_OTHER)
Admission: EM | Admit: 2020-02-02 | Discharge: 2020-02-02 | Disposition: A | Discharge status: Home / Self Care | Payer: Managed Care, Other (non HMO) | Attending: Emergency Medicine | Admitting: Emergency Medicine

## 2020-02-02 ENCOUNTER — Other Ambulatory Visit: Payer: Self-pay

## 2020-02-02 ENCOUNTER — Emergency Department (HOSPITAL_BASED_OUTPATIENT_CLINIC_OR_DEPARTMENT_OTHER): Payer: Managed Care, Other (non HMO)

## 2020-02-02 ENCOUNTER — Encounter (HOSPITAL_BASED_OUTPATIENT_CLINIC_OR_DEPARTMENT_OTHER): Payer: Self-pay | Admitting: Emergency Medicine

## 2020-02-02 DIAGNOSIS — R05 Cough: Secondary | ICD-10-CM

## 2020-02-02 DIAGNOSIS — U071 COVID-19: Secondary | ICD-10-CM | POA: Insufficient documentation

## 2020-02-02 DIAGNOSIS — R079 Chest pain, unspecified: Secondary | ICD-10-CM | POA: Diagnosis not present

## 2020-02-02 DIAGNOSIS — J1282 Pneumonia due to coronavirus disease 2019: Secondary | ICD-10-CM | POA: Diagnosis not present

## 2020-02-02 DIAGNOSIS — R059 Cough, unspecified: Secondary | ICD-10-CM

## 2020-02-02 DIAGNOSIS — J45909 Unspecified asthma, uncomplicated: Secondary | ICD-10-CM | POA: Insufficient documentation

## 2020-02-02 MED ORDER — DEXAMETHASONE SODIUM PHOSPHATE 10 MG/ML IJ SOLN
10.0000 mg | Freq: Once | INTRAMUSCULAR | Status: AC
  Administered 2020-02-02: 10 mg via INTRAMUSCULAR
  Filled 2020-02-02: qty 1

## 2020-02-02 MED ORDER — ALBUTEROL SULFATE HFA 108 (90 BASE) MCG/ACT IN AERS: 2.0000 | INHALATION_SPRAY | RESPIRATORY_TRACT | Status: DC | PRN

## 2020-02-02 MED ORDER — ALBUTEROL SULFATE HFA 108 (90 BASE) MCG/ACT IN AERS
INHALATION_SPRAY | RESPIRATORY_TRACT | Status: AC
  Filled 2020-02-02: qty 6.7

## 2020-02-02 NOTE — ED Triage Notes (Signed)
Pt returns to ED due to worsening cough. Dxed with covid on 8/12. Currently on day 6 of s/s. Continues to complain of cough and diarrhea. Coughing so hard that she sometimes vomits and has chest pain.   Ambulated to room on room air - SpO2 down to 96%, HR 120 after ambulation.

## 2020-02-02 NOTE — ED Provider Notes (Signed)
[facility] EMERGENCY DEPARTMENT Provider Note   CSN: [id number] Arrival date & time: 02/02/20  0304     History Chief Complaint  Patient presents with  . Cough    covid+    Anna Bright is a 24 y.o. female.  HPI     There is a 24 year old female who presents with cough.  Recent diagnosis on 8/12 of COVID-19.  She is on day 6 of illness.  She reports ongoing cough.  She states when she coughs forcefully she has some anterior chest pain that is nonradiating.  She reports ongoing chills.  No fevers.  She does report a history of asthma.  Patient states that she has taken over-the-counter medication without relief.  She is not vaccinated.  Past Medical History:  Diagnosis Date  . Asthma     There are no problems to display for this patient.   History reviewed. No pertinent surgical history.   OB History   No obstetric history on file.     History reviewed. No pertinent family history.  Social History   Tobacco Use  . Smoking status: Never Smoker  . Smokeless tobacco: Never Used  Substance Use Topics  . Alcohol use: Yes  . Drug use: No    Home Medications Prior to Admission medications   Medication Sig Start Date End Date Taking? Authorizing Provider  ibuprofen (ADVIL) 800 MG tablet Take 1 tablet (800 mg total) by mouth 3 (three) times daily. 01/29/20   Arby Barrette, MD  ondansetron (ZOFRAN ODT) 4 MG disintegrating tablet Take 1 tablet (4 mg total) by mouth every 8 (eight) hours as needed for nausea or vomiting. 06/29/18   Little, Ambrose Finland, MD  ondansetron (ZOFRAN ODT) 4 MG disintegrating tablet Take 1 tablet (4 mg total) by mouth every 4 (four) hours as needed for nausea or vomiting. 01/29/20   Arby Barrette, MD    Allergies    Patient has no known allergies.  Review of Systems   Review of Systems  Constitutional: Positive for chills. Negative for fever.  Respiratory: Positive for cough and shortness of breath.   Cardiovascular: Positive  for chest pain. Negative for leg swelling.  Gastrointestinal: Positive for vomiting. Negative for abdominal pain and nausea.  All other systems reviewed and are negative.   Physical Exam Updated Vital Signs BP 121/77 (BP Location: Right Arm)   Pulse 100   Temp 99 F (37.2 C)   Resp 18   SpO2 98%   Physical Exam Vitals and nursing note reviewed.  Constitutional:      Appearance: She is well-developed. She is not ill-appearing.  HENT:     Head: Normocephalic and atraumatic.     Nose: Nose normal.     Mouth/Throat:     Mouth: Mucous membranes are moist.  Eyes:     Pupils: Pupils are equal, round, and reactive to light.  Cardiovascular:     Rate and Rhythm: Normal rate and regular rhythm.     Heart sounds: Normal heart sounds.     Comments: Tenderness palpation anterior chest wall Pulmonary:     Effort: Pulmonary effort is normal. No respiratory distress.     Breath sounds: Wheezing present.     Comments: Occasional expiratory wheeze Abdominal:     General: Bowel sounds are normal.     Palpations: Abdomen is soft.     Tenderness: There is no abdominal tenderness.  Musculoskeletal:     Cervical back: Neck supple.  Skin:    General:  Skin is warm and dry.  Neurological:     Mental Status: She is alert and oriented to person, place, and time.  Psychiatric:        Mood and Affect: Mood normal.     ED Results / Procedures / Treatments   Labs (all labs ordered are listed, but only abnormal results are displayed) Labs Reviewed - No data to display  EKG None  Radiology DG Chest Portable 1 View  Result Date: 02/02/2020 CLINICAL DATA:  Worsening cough.  COVID positive 3 days ago. EXAM: PORTABLE CHEST 1 VIEW COMPARISON:  None. FINDINGS: Patchy bibasilar airspace opacities, left greater than right. The heart is normal in size. Normal mediastinal contours. No pneumothorax or large pleural effusion. No acute osseous abnormalities are seen. IMPRESSION: Patchy bibasilar airspace  opacities, left greater than right, pattern consistent with COVID pneumonia. Electronically Signed   By: Narda Rutherford M.D.   On: 02/02/2020 03:48    Procedures Procedures (including critical care time)  Medications Ordered in ED Medications  albuterol (VENTOLIN HFA) 108 (90 Base) MCG/ACT inhaler 2 puff ( Inhalation Given 02/02/20 0325)  dexamethasone (DECADRON) injection 10 mg (10 mg Intramuscular Given 02/02/20 0402)    ED Course  I have reviewed the triage vital signs and the nursing notes.  Pertinent labs & imaging results that were available during my care of the patient were reviewed by me and considered in my medical decision making (see chart for details).    MDM Rules/Calculators/A&P                           Patient presents with worsening cough.  Recent diagnosis of COVID-19.  She is overall nontoxic and vital signs are largely reassuring.  She is afebrile.  O2 sats 98%.  She is coughing frequently on my evaluation.  She is otherwise nontoxic.  Chest x-ray obtained to evaluate for pneumothorax or pneumonia.  Chest x-ray is consistent with COVID-19.  Given that her chest pain is worse with coughing, have high suspicion this is the cause of the pain.  Lower suspicion for PE.  She remains hemodynamically stable.  However, I discussed with her that she could worsen given that she is only on day 6 of illness.  Recommend getting a pulse oximeter.  Supportive measures at home.  Given history of asthma, she was given Decadron and an inhaler.  After history, exam, and medical workup I feel the patient has been appropriately medically screened and is safe for discharge home. Pertinent diagnoses were discussed with the patient. Patient was given return precautions.   Final Clinical Impression(s) / ED Diagnoses Final diagnoses:  Pneumonia due to COVID-19 virus  Cough    Rx / DC Orders ED Discharge Orders    None       Ayad Nieman, Mayer Masker, MD 02/02/20 [phone]

## 2020-02-02 NOTE — Discharge Instructions (Addendum)
You were seen today for worsening cough and shortness of breath related to COVID-19.  Your oxygen saturations remain normal.  Your chest x-ray has evidence of COVID-19 pneumonia.  You may use the inhaler as needed.  Additionally, you should obtain a portable pulse ox from the drugstore, if your oxygen saturations fall below 92%, you need to be reevaluated.

## 2020-02-09 ENCOUNTER — Telehealth: Payer: Managed Care, Other (non HMO) | Admitting: Family

## 2020-02-09 DIAGNOSIS — Z20822 Contact with and (suspected) exposure to covid-19: Secondary | ICD-10-CM

## 2020-02-09 MED ORDER — PROMETHAZINE-DM 6.25-15 MG/5ML PO SYRP: 5.0000 mL | ORAL_SOLUTION | Freq: Four times a day (QID) | ORAL | 0 refills | Status: DC | PRN

## 2020-02-09 NOTE — Progress Notes (Signed)
E-Visit for Corona Virus Screening  Your current symptoms could be consistent with the coronavirus.  Many health care providers can now test patients at their office but not all are.  [facility] has multiple testing sites. For information on our COVID testing locations and hours go to [email or web address]  We are enrolling you in our MyChart Home Monitoring for COVID19 . Daily you will receive a questionnaire within the MyChart website. Our COVID 19 response team will be monitoring your responses daily.  Testing Information: The COVID-19 Community Testing sites will begin testing BY APPOINTMENT ONLY.  You can schedule online at [email or web address]  If you do not have access to a smart phone or computer you may call [phone] for an appointment.   Additional testing sites in the Community:  . For [facility] Testing sites in [place]  https://www.cvs.com/minuteclinic/covid-19-testing  . For Pop-up testing sites in   https://covid19.ncdhhs.gov/about-covid-19/testing/find-my-testing-place/pop-testing-sites  . For Testing sites with regular hours https://onsms.org/[place]/  . For [place] [facility] https://[facility].com/covid-19-community-outreach-testing/  . For [facility] https://[place].gov[place]/human-services/health-department/coronavirus-covid-19-info/covid-19-testing  . For [place] testing in [place] and [place] https://www.[place].gov/[place]/human-services/health-department/coronavirus-covid-19-info/covid-19-testing  . For [facility] testing in  County   https://[facility].care/covidtesting  For  more information about community testing call [phone]   Please quarantine yourself while awaiting your test results. Please stay home for a minimum of 10 days from the first day of illness with improving symptoms and you have had 24 hours of no fever (without the use of Tylenol (Acetaminophen)  Motrin (Ibuprofen) or any fever reducing medication).  Also - Do not get tested prior to returning to work because once you have had a positive test the test can stay positive for more then a month in some cases.   You should wear a mask or cloth face covering over your nose and mouth if you must be around other people or animals, including pets (even at home). Try to stay at least 6 feet away from other people. This will protect the people around you.  Please continue good preventive care measures, including:  frequent hand-washing, avoid touching your face, cover coughs/sneezes, stay out of crowds and keep a 6 foot distance from others.  COVID-19 is a respiratory illness with symptoms that are similar to the flu. Symptoms are typically mild to moderate, but there have been cases of severe illness and death due to the virus.   The following symptoms may appear 2-14 days after exposure: . Fever . Cough . Shortness of breath or difficulty breathing . Chills . Repeated shaking with chills . Muscle pain . Headache . Sore throat . New loss of taste or smell . Fatigue . Congestion or runny nose . Nausea or vomiting . Diarrhea  Go to the nearest hospital ED for assessment if fever/cough/breathlessness are severe or illness seems like a threat to life.  It is vitally important that if you feel that you have an infection such as this virus or any other virus that you stay home and away from places where you may spread it to others.  You should avoid contact with people age 65 and older.   You can use medication such as A prescription cough medication called Phenergan DM 6.25 mg/15 mg. You make take one teaspoon / 5 ml every 4-6 hours as needed for cough  You may also take acetaminophen (Tylenol) as needed for fever.  Reduce your risk of any infection by using the same precautions used for avoiding the common cold or flu:  .   Wash your hands often with soap and warm water for at least 20 seconds.  If  soap and water are not readily available, use an alcohol-based hand sanitizer with at least 60% alcohol.  . If coughing or sneezing, cover your mouth and nose by coughing or sneezing into the elbow areas of your shirt or coat, into a tissue or into your sleeve (not your hands). . Avoid shaking hands with others and consider head nods or verbal greetings only. . Avoid touching your eyes, nose, or mouth with unwashed hands.  . Avoid close contact with people who are sick. . Avoid places or events with large numbers of people in one location, like concerts or sporting events. . Carefully consider travel plans you have or are making. . If you are planning any travel outside or inside the [place], visit the CDC's Travelers' Health webpage for the latest health notices. . If you have some symptoms but not all symptoms, continue to monitor at home and seek medical attention if your symptoms worsen. . If you are having a medical emergency, call 911.  HOME CARE . Only take medications as instructed by your medical team. . Drink plenty of fluids and get plenty of rest. . A steam or ultrasonic humidifier can help if you have congestion.   GET HELP RIGHT AWAY IF YOU HAVE EMERGENCY WARNING SIGNS** FOR COVID-19. If you or someone is showing any of these signs seek emergency medical care immediately. Call 911 or proceed to your closest emergency facility if: . You develop worsening high fever. . Trouble breathing . Bluish lips or face . Persistent pain or pressure in the chest . New confusion . Inability to wake or stay awake . You cough up blood. . Your symptoms become more severe  **This list is not all possible symptoms. Contact your medical provider for any symptoms that are sever or concerning to you.  MAKE SURE YOU   Understand these instructions.  Will watch your condition.  Will get help right away if you are not doing well or get worse.  Your e-visit answers were reviewed by a board  certified advanced clinical practitioner to complete your personal care plan.  Depending on the condition, your plan could have included both over the counter or prescription medications.  If there is a problem please reply once you have received a response from your provider.  Your safety is important to us.  If you have drug allergies check your prescription carefully.    You can use MyChart to ask questions about today's visit, request a non-urgent call back, or ask for a work or school excuse for 24 hours related to this e-Visit. If it has been greater than 24 hours you will need to follow up with your provider, or enter a new e-Visit to address those concerns. You will get an e-mail in the next two days asking about your experience.  I hope that your e-visit has been valuable and will speed your recovery. Thank you for using e-visits.   Greater than 5 minutes, yet less than 10 minutes of time have been spent researching, coordinating, and implementing care for this patient today.  Thank you for the details you included in the comment boxes. Those details are very helpful in determining the best course of treatment for you and help us to provide the best care.  

## 2021-04-11 ENCOUNTER — Other Ambulatory Visit: Payer: Self-pay

## 2021-04-11 ENCOUNTER — Emergency Department (HOSPITAL_BASED_OUTPATIENT_CLINIC_OR_DEPARTMENT_OTHER)
Admission: EM | Admit: 2021-04-11 | Discharge: 2021-04-11 | Disposition: A | Discharge status: Home / Self Care | Payer: Medicaid Other | Attending: Emergency Medicine | Admitting: Emergency Medicine

## 2021-04-11 ENCOUNTER — Ambulatory Visit (HOSPITAL_BASED_OUTPATIENT_CLINIC_OR_DEPARTMENT_OTHER): Payer: Managed Care, Other (non HMO)

## 2021-04-11 ENCOUNTER — Encounter (HOSPITAL_BASED_OUTPATIENT_CLINIC_OR_DEPARTMENT_OTHER): Payer: Self-pay | Admitting: *Deleted

## 2021-04-11 DIAGNOSIS — O208 Other hemorrhage in early pregnancy: Secondary | ICD-10-CM | POA: Diagnosis present

## 2021-04-11 DIAGNOSIS — J45909 Unspecified asthma, uncomplicated: Secondary | ICD-10-CM | POA: Insufficient documentation

## 2021-04-11 DIAGNOSIS — Z3A10 10 weeks gestation of pregnancy: Secondary | ICD-10-CM | POA: Insufficient documentation

## 2021-04-11 DIAGNOSIS — N938 Other specified abnormal uterine and vaginal bleeding: Secondary | ICD-10-CM | POA: Diagnosis not present

## 2021-04-11 DIAGNOSIS — O4690 Antepartum hemorrhage, unspecified, unspecified trimester: Secondary | ICD-10-CM

## 2021-04-11 DIAGNOSIS — N9489 Other specified conditions associated with female genital organs and menstrual cycle: Secondary | ICD-10-CM | POA: Diagnosis not present

## 2021-04-11 HISTORY — DX: Complete or unspecified spontaneous abortion without complication: O03.9

## 2021-04-11 LAB — CBC WITH DIFFERENTIAL/PLATELET
Abs Immature Granulocytes: 0.03 10*3/uL (ref 0.00–0.07)
Basophils Absolute: 0 10*3/uL (ref 0.0–0.1)
Basophils Relative: 0 %
Eosinophils Absolute: 0.2 10*3/uL (ref 0.0–0.5)
Eosinophils Relative: 2 %
HCT: 34.6 % — ABNORMAL LOW (ref 36.0–46.0)
Hemoglobin: 10.9 g/dL — ABNORMAL LOW (ref 12.0–15.0)
Immature Granulocytes: 0 %
Lymphocytes Relative: 25 %
Lymphs Abs: 1.8 10*3/uL (ref 0.7–4.0)
MCH: 22.7 pg — ABNORMAL LOW (ref 26.0–34.0)
MCHC: 31.5 g/dL (ref 30.0–36.0)
MCV: 71.9 fL — ABNORMAL LOW (ref 80.0–100.0)
Monocytes Absolute: 0.5 10*3/uL (ref 0.1–1.0)
Monocytes Relative: 6 %
Neutro Abs: 4.9 10*3/uL (ref 1.7–7.7)
Neutrophils Relative %: 67 %
Platelets: 323 10*3/uL (ref 150–400)
RBC: 4.81 MIL/uL (ref 3.87–5.11)
RDW: 15 % (ref 11.5–15.5)
WBC: 7.4 10*3/uL (ref 4.0–10.5)
nRBC: 0 % (ref 0.0–0.2)

## 2021-04-11 LAB — HCG, QUANTITATIVE, PREGNANCY: hCG, Beta Chain, Quant, S: 230386 m[IU]/mL — ABNORMAL HIGH (ref <5)

## 2021-04-11 NOTE — ED Triage Notes (Signed)
Pt reports that she is a pt of Dr. Shawnie Pons and she is [redacted] weeks pregnant and noted "spotting" this am.  LMP 02/01/21 and her EDD 11/08/2021. Pt has two children, has had one miscarriage and this is her 4th pregnancy. Pt denies any pain or cramping, no fluids leaking from vagina. Pt has had prenatal care.

## 2021-04-11 NOTE — ED Provider Notes (Signed)
[facility] EMERGENCY DEPARTMENT Provider Note   CSN: [id number] Arrival date & time: 04/11/21  0803     History Chief Complaint  Patient presents with   Vaginal Bleeding    Anna Bright is a 25 y.o. female.  Presented to the ER with concern for vaginal bleeding.  Patient reports that she is [redacted] weeks pregnant by LMP.  States that she has been evaluated by her OB/GYN already and had a confirmatory ultrasound, which confirmed her dates.  Patient endorses 2 healthy pregnancies and had 1 miscarriage.  States that this morning she noted for small amount of vaginal spotting.  Denies any pain or cramping.  No gush of fluids.  No abdominal pain or pelvic pain.  HPI     Past Medical History:  Diagnosis Date   Asthma    Miscarriage    04/2016 (at around 8 weeks)   Vaginal delivery    2016, 2019    There are no problems to display for this patient.   History reviewed. No pertinent surgical history.   OB History   No obstetric history on file.     No family history on file.  Social History   Tobacco Use   Smoking status: Never   Smokeless tobacco: Never  Substance Use Topics   Alcohol use: Yes   Drug use: No    Home Medications Prior to Admission medications   Medication Sig Start Date End Date Taking? Authorizing Provider  ibuprofen (ADVIL) 800 MG tablet Take 1 tablet (800 mg total) by mouth 3 (three) times daily. 01/29/20   Arby Barrette, MD  ondansetron (ZOFRAN ODT) 4 MG disintegrating tablet Take 1 tablet (4 mg total) by mouth every 8 (eight) hours as needed for nausea or vomiting. 06/29/18   Little, Ambrose Finland, MD  ondansetron (ZOFRAN ODT) 4 MG disintegrating tablet Take 1 tablet (4 mg total) by mouth every 4 (four) hours as needed for nausea or vomiting. 01/29/20   Arby Barrette, MD  promethazine-dextromethorphan (PROMETHAZINE-DM) 6.25-15 MG/5ML syrup Take 5 mLs by mouth 4 (four) times daily as needed. 02/09/20   Eulis Foster, FNP    Allergies     Patient has no known allergies.  Review of Systems   Review of Systems  Constitutional:  Negative for chills and fever.  HENT:  Negative for ear pain and sore throat.   Eyes:  Negative for pain and visual disturbance.  Respiratory:  Negative for cough and shortness of breath.   Cardiovascular:  Negative for chest pain and palpitations.  Gastrointestinal:  Negative for abdominal pain and vomiting.  Genitourinary:  Positive for vaginal bleeding. Negative for dysuria and hematuria.  Musculoskeletal:  Negative for arthralgias and back pain.  Skin:  Negative for color change and rash.  Neurological:  Negative for seizures and syncope.  All other systems reviewed and are negative.  Physical Exam Updated Vital Signs BP 105/71   Pulse 86   Temp 97.9 F (36.6 C) (Oral)   Resp 16   Wt 78.5 kg   SpO2 100%   BMI 32.69 kg/m   Physical Exam Vitals and nursing note reviewed.  Constitutional:      General: She is not in acute distress.    Appearance: She is well-developed.  HENT:     Head: Normocephalic and atraumatic.  Eyes:     Conjunctiva/sclera: Conjunctivae normal.  Cardiovascular:     Rate and Rhythm: Normal rate and regular rhythm.     Heart sounds: No murmur heard.  Pulmonary:     Effort: Pulmonary effort is normal. No respiratory distress.     Breath sounds: Normal breath sounds.  Abdominal:     Palpations: Abdomen is soft.     Tenderness: There is no abdominal tenderness.  Musculoskeletal:     Cervical back: Neck supple.  Skin:    General: Skin is warm and dry.  Neurological:     General: No focal deficit present.     Mental Status: She is alert.  Psychiatric:        Mood and Affect: Mood normal.        Behavior: Behavior normal.    ED Results / Procedures / Treatments   Labs (all labs ordered are listed, but only abnormal results are displayed) Labs Reviewed  CBC WITH DIFFERENTIAL/PLATELET - Abnormal; Notable for the following components:      Result Value    Hemoglobin 10.9 (*)    HCT 34.6 (*)    MCV 71.9 (*)    MCH 22.7 (*)    All other components within normal limits  HCG, QUANTITATIVE, PREGNANCY    EKG None  Radiology No results found.  Procedures Procedures   Medications Ordered in ED Medications - No data to display  ED Course  I have reviewed the triage vital signs and the nursing notes.  Pertinent labs & imaging results that were available during my care of the patient were reviewed by me and considered in my medical decision making (see chart for details).  Clinical Course as of 04/11/21 0920  Mon Apr 11, 2021  0805 ABO Group  A  Rh Typing  POS  [RD]    Clinical Course User Index [RD] Milagros Loll, MD   MDM Rules/Calculators/A&P                           G4P2 at 10 weeks presenting for vaginal spotting. Per chart review is Rh positive.  I delivered quant and ultrasound to further evaluate.  While awaiting for this test to be completed, RN notified me that patient had called her OB/GYN office and had made appointment at 9:30 AM this morning to see her gynecologist.  Discussed with patient option of remaining in ER to get ultrasound as originally planned however patient strongly desired to leave and see her gynecologist.  Given this very close outpatient follow-up, her report of having previously confirmed IUP, Rh+, normal vital signs and relatively minimal symptoms, believe this is an appropriate alternative to further ER work-up.  Instructed patient as she must keep this appointment and go directly to the gynecology office from here.   Final Clinical Impression(s) / ED Diagnoses Final diagnoses:  Vaginal bleeding during pregnancy    Rx / DC Orders ED Discharge Orders     None        Milagros Loll, MD 04/11/21 [phone]

## 2021-04-11 NOTE — Discharge Instructions (Signed)
We recommended getting an ultrasound to check on your pregnancy.  Please go immediately to your doctor's office appointment and discuss her symptoms with your gynecologist.

## 2021-04-24 IMAGING — US US ABDOMEN LIMITED
1 series · 14 of 25 positions shown · non-contrast
Comparison: June 29, 2018

CLINICAL DATA: Upper abdominal pain with nausea and vomiting

EXAM:
ULTRASOUND ABDOMEN LIMITED RIGHT UPPER QUADRANT

[Series 1: us abdomen limited · 14 of 67 slices shown]
[im 1/67]
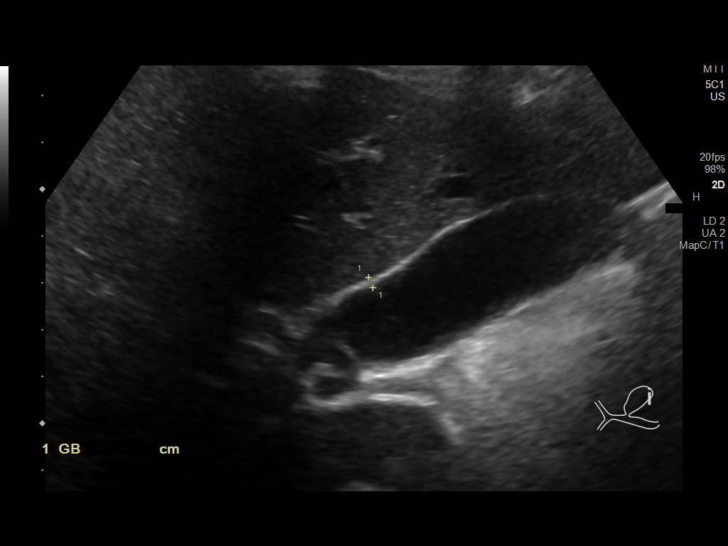
[im 6/67]
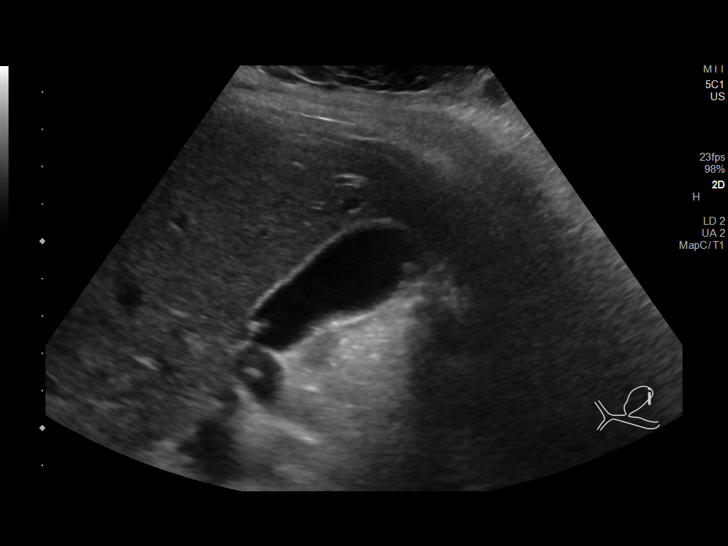
[im 12/67]
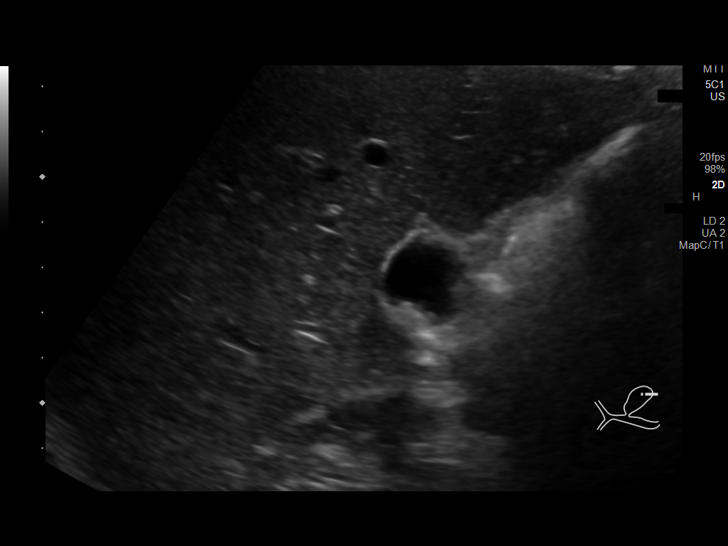
[im 17/67]
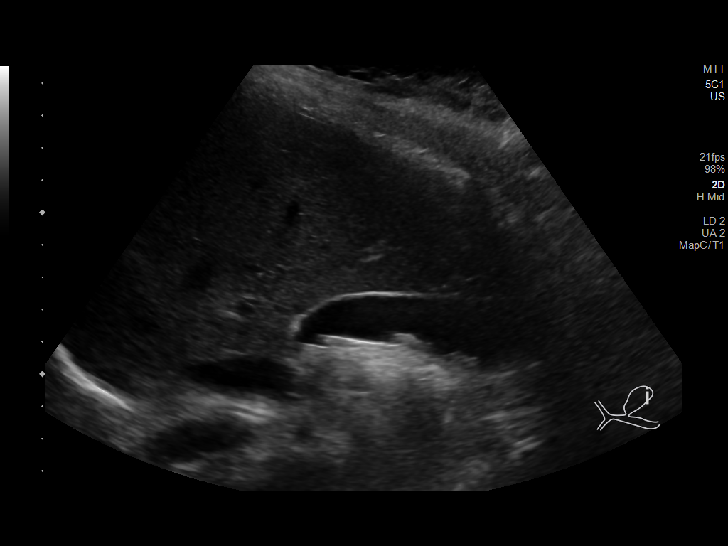
[im 23/67]
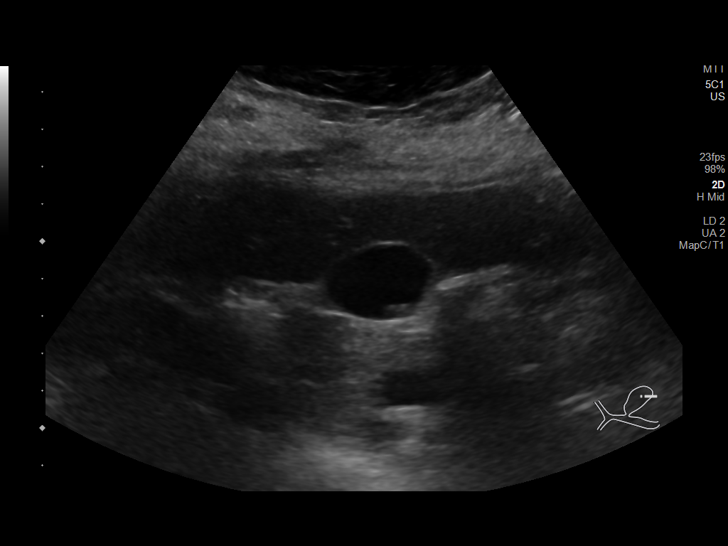
[im 25/67]
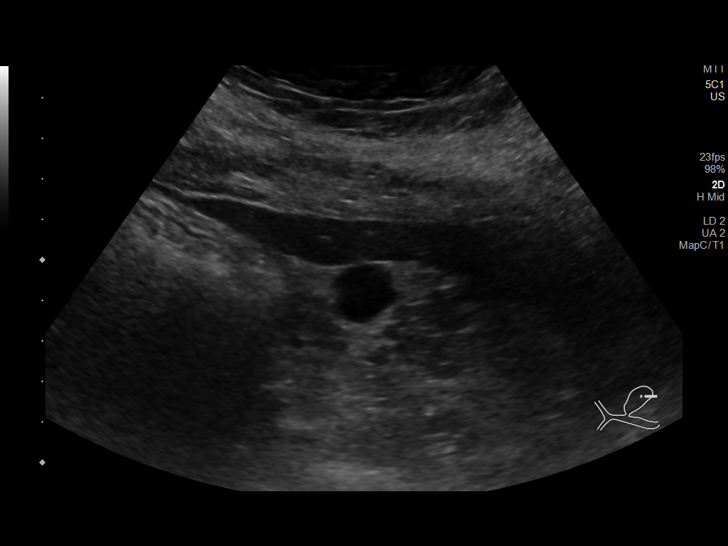
[im 31/67]
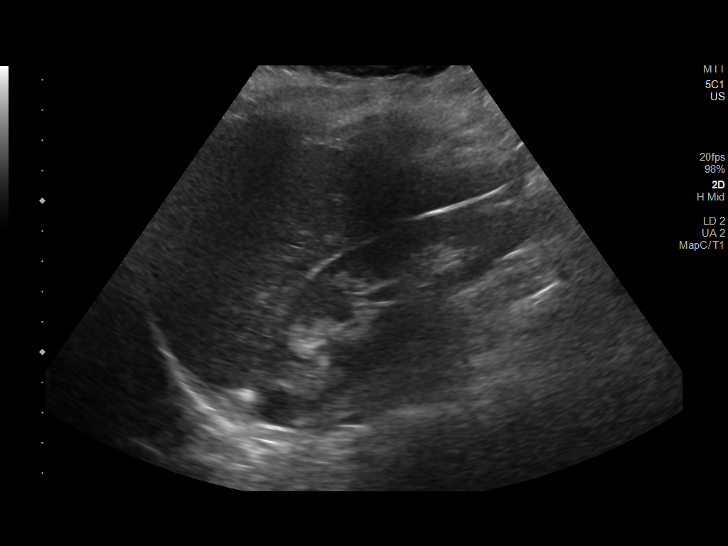
[im 36/67]
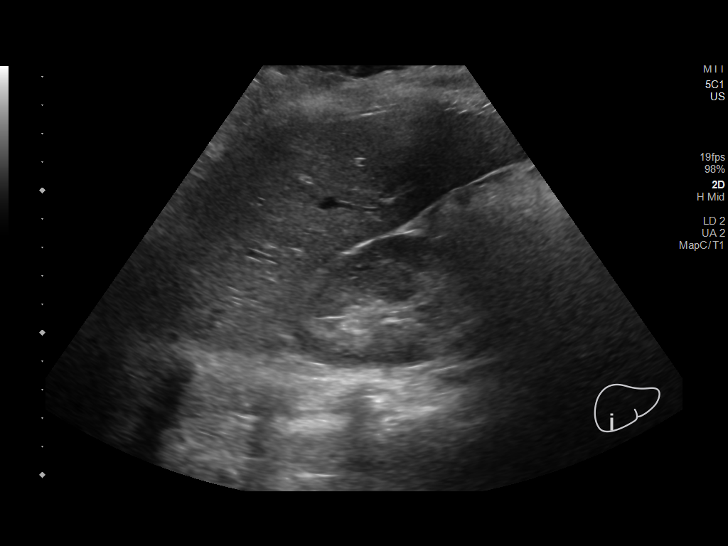
[im 42/67]
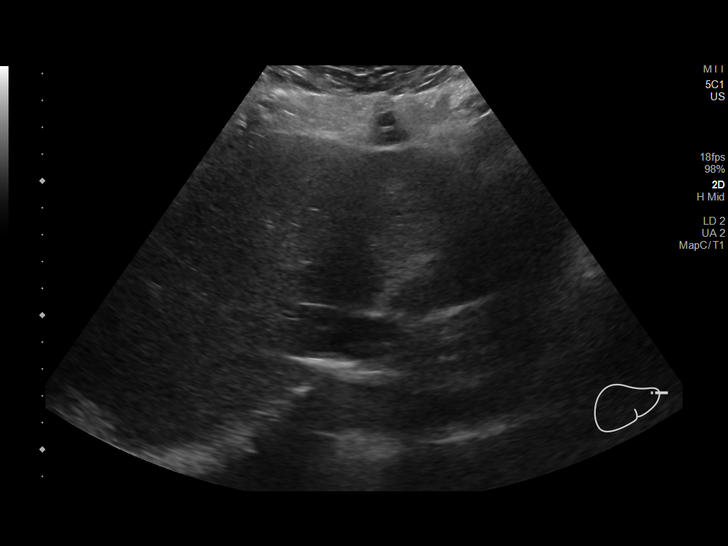
[im 45/67]
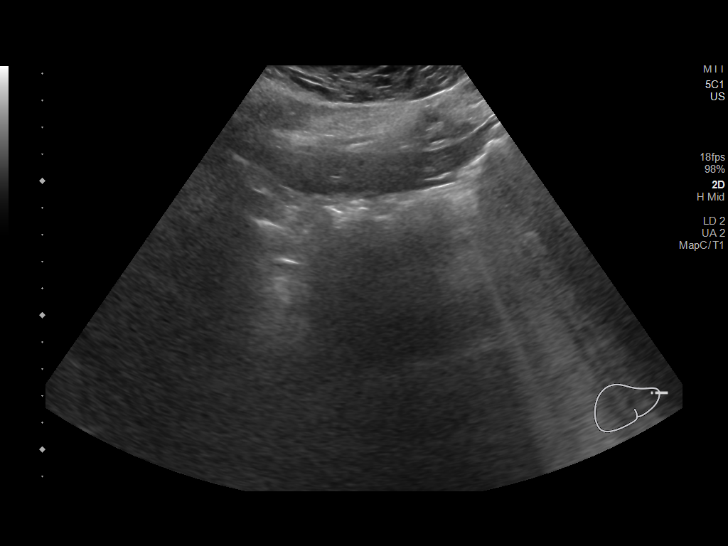
[im 50/67]
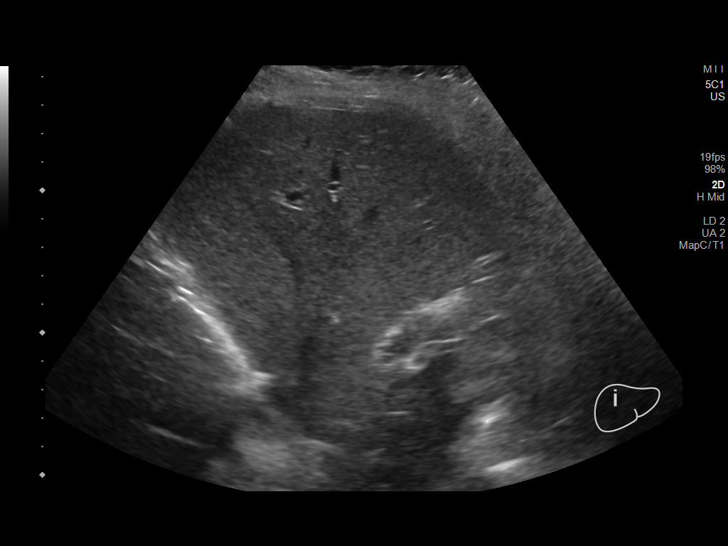
[im 56/67]
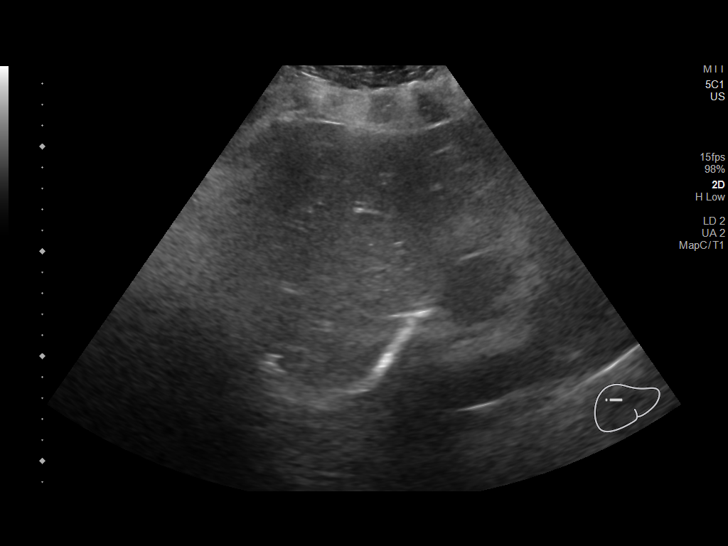
[im 61/67]
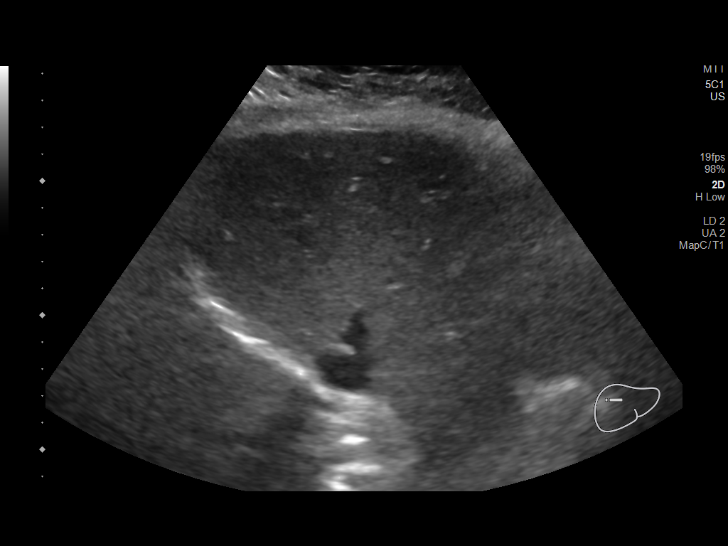
[im 67/67]
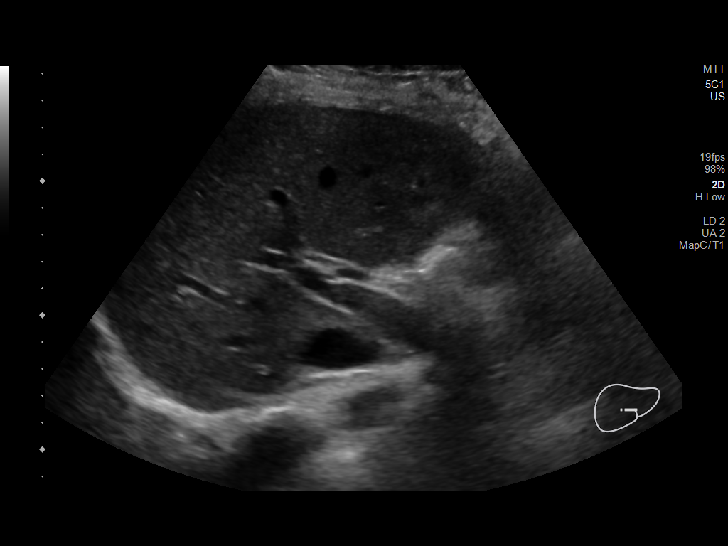

[14 of 25 positions shown; findings below may reference images not displayed]

FINDINGS: Within the gallbladder, there are echogenic foci which move and
shadow consistent with cholelithiasis. Largest gallstone measures 5
mm. There is also a 3 mm echogenic focus which does not move or
shadow which may represent a small polyp. There is no gallbladder
wall thickening or pericholecystic fluid. No sonographic Murphy sign
noted by sonographer.

Common bile duct:

Diameter: 4 mm. No intrahepatic or extrahepatic biliary duct
dilatation.

Liver:

No focal lesion identified. Within normal limits in parenchymal
echogenicity. Portal vein is patent on color Doppler imaging with
normal direction of blood flow towards the liver.

Other: None.
IMPRESSION: Cholelithiasis. Probable 3 mm polyp as well in the gallbladder. No
gallbladder wall thickening or pericholecystic fluid.

Study otherwise unremarkable.

## 2022-01-16 ENCOUNTER — Encounter (HOSPITAL_BASED_OUTPATIENT_CLINIC_OR_DEPARTMENT_OTHER): Payer: Self-pay | Admitting: Emergency Medicine

## 2022-01-16 ENCOUNTER — Other Ambulatory Visit: Payer: Self-pay

## 2022-01-16 DIAGNOSIS — M545 Low back pain, unspecified: Secondary | ICD-10-CM | POA: Insufficient documentation

## 2022-01-16 DIAGNOSIS — Z5321 Procedure and treatment not carried out due to patient leaving prior to being seen by health care provider: Secondary | ICD-10-CM | POA: Insufficient documentation

## 2022-01-16 DIAGNOSIS — M542 Cervicalgia: Secondary | ICD-10-CM | POA: Diagnosis not present

## 2022-01-16 DIAGNOSIS — Y92410 Unspecified street and highway as the place of occurrence of the external cause: Secondary | ICD-10-CM | POA: Diagnosis not present

## 2022-01-16 LAB — PREGNANCY, URINE: Preg Test, Ur: NEGATIVE
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
## 2022-01-17 ENCOUNTER — Emergency Department (HOSPITAL_BASED_OUTPATIENT_CLINIC_OR_DEPARTMENT_OTHER): Payer: Medicaid Other

## 2022-01-17 ENCOUNTER — Emergency Department (HOSPITAL_BASED_OUTPATIENT_CLINIC_OR_DEPARTMENT_OTHER)
Admission: EM | Admit: 2022-01-17 | Discharge: 2022-01-17 | Discharge status: Against Medical Advice | Payer: Medicaid Other | Attending: Emergency Medicine | Admitting: Emergency Medicine

## 2022-03-16 IMAGING — DX DG CHEST 1V PORT
1 series · 1 of 1 positions shown · non-contrast
Comparison: None.

CLINICAL DATA: Worsening cough.  COVID positive 3 days ago.

EXAM:
PORTABLE CHEST 1 VIEW

[chest ap]
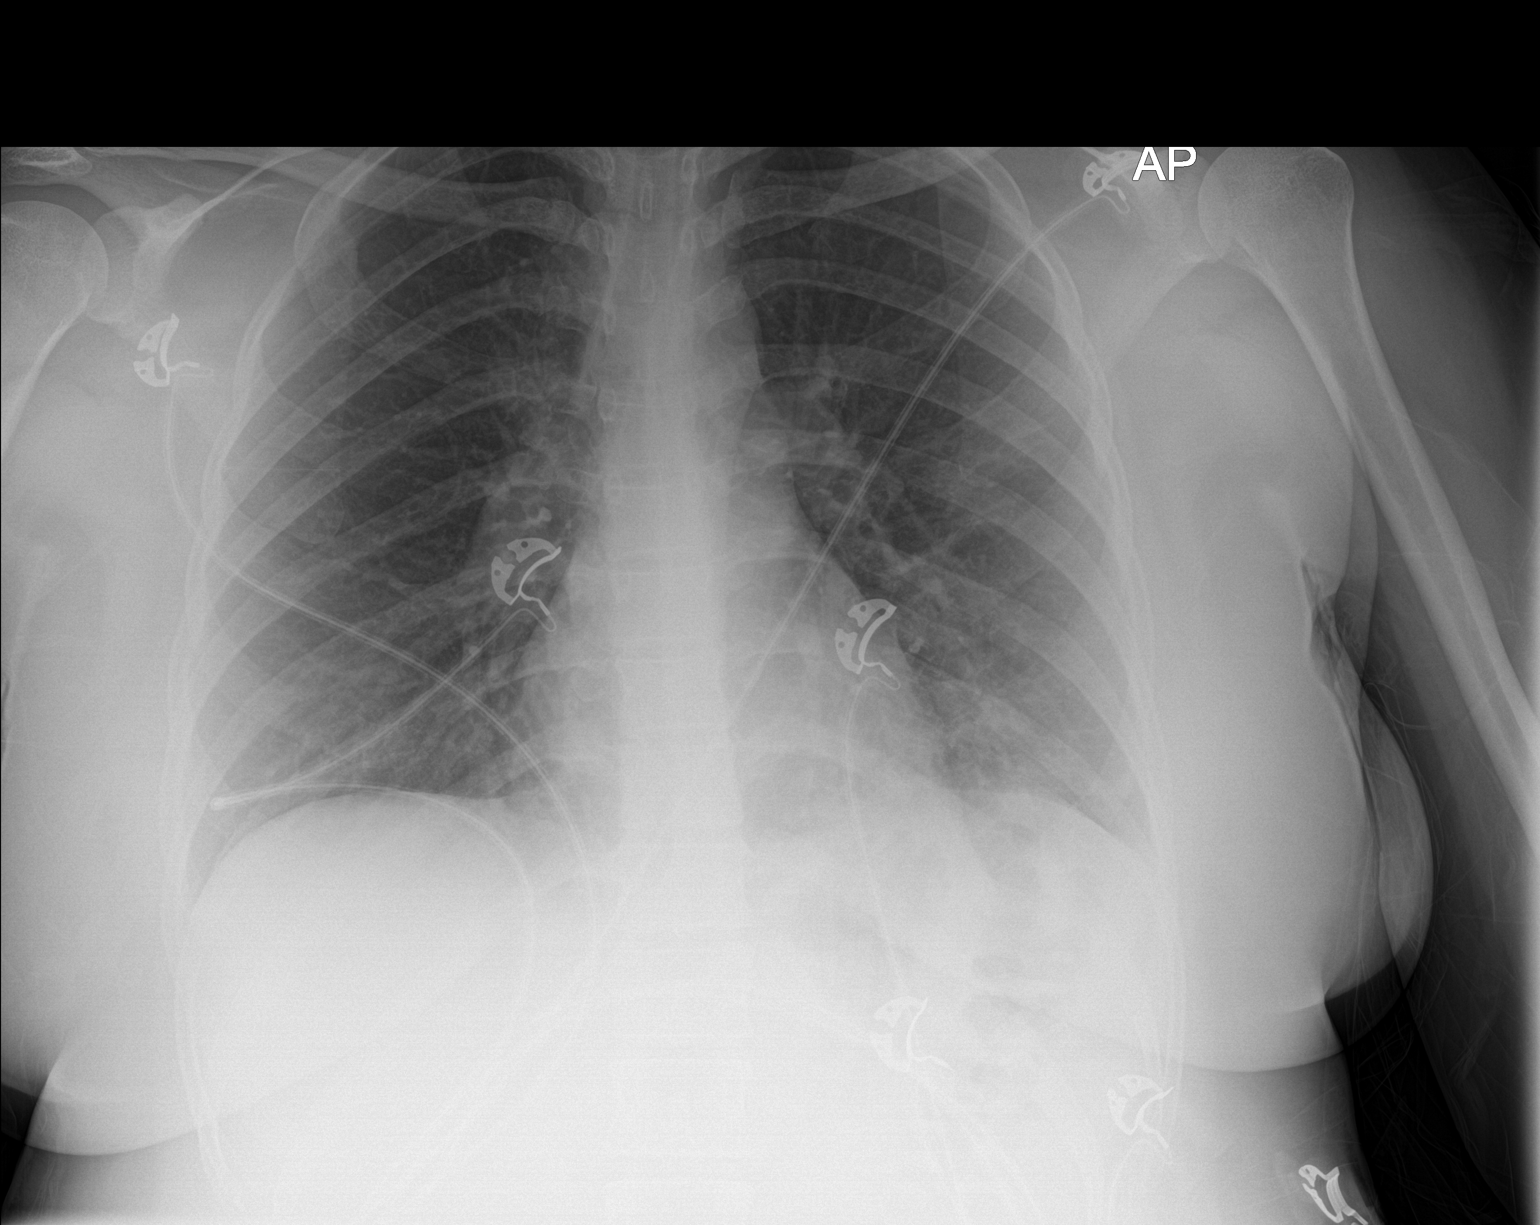

[1 of 1 positions shown; findings below may reference images not displayed]

FINDINGS: Patchy bibasilar airspace opacities, left greater than right. The
heart is normal in size. Normal mediastinal contours. No
pneumothorax or large pleural effusion. No acute osseous
abnormalities are seen.
IMPRESSION: Patchy bibasilar airspace opacities, left greater than right,
pattern consistent with COVID pneumonia.

## 2023-02-10 ENCOUNTER — Emergency Department (HOSPITAL_BASED_OUTPATIENT_CLINIC_OR_DEPARTMENT_OTHER)
Admission: EM | Admit: 2023-02-10 | Discharge: 2023-02-10 | Disposition: A | Discharge status: Home / Self Care | Payer: Medicaid Other | Attending: Emergency Medicine | Admitting: Emergency Medicine

## 2023-02-10 ENCOUNTER — Emergency Department (HOSPITAL_BASED_OUTPATIENT_CLINIC_OR_DEPARTMENT_OTHER): Payer: Medicaid Other

## 2023-02-10 ENCOUNTER — Encounter (HOSPITAL_BASED_OUTPATIENT_CLINIC_OR_DEPARTMENT_OTHER): Payer: Self-pay

## 2023-02-10 ENCOUNTER — Other Ambulatory Visit: Payer: Self-pay

## 2023-02-10 DIAGNOSIS — X501XXA Overexertion from prolonged static or awkward postures, initial encounter: Secondary | ICD-10-CM | POA: Diagnosis not present

## 2023-02-10 DIAGNOSIS — M25572 Pain in left ankle and joints of left foot: Secondary | ICD-10-CM | POA: Diagnosis present

## 2023-02-10 DIAGNOSIS — Y92828 Other wilderness area as the place of occurrence of the external cause: Secondary | ICD-10-CM | POA: Insufficient documentation

## 2023-02-10 DIAGNOSIS — S93402A Sprain of unspecified ligament of left ankle, initial encounter: Secondary | ICD-10-CM

## 2023-02-10 DIAGNOSIS — Y9301 Activity, walking, marching and hiking: Secondary | ICD-10-CM | POA: Insufficient documentation

## 2023-02-10 MED ORDER — IBUPROFEN 400 MG PO TABS
600.0000 mg | ORAL_TABLET | Freq: Once | ORAL | Status: AC
  Administered 2023-02-10: 600 mg via ORAL
  Filled 2023-02-10: qty 1

## 2023-02-10 NOTE — ED Triage Notes (Signed)
The patient tripped walking down a hill last night and felt her left ankle twist. She is having pain.

## 2023-02-10 NOTE — ED Provider Notes (Signed)
[facility] Provider Note   CSN: [id number] Arrival date & time: 02/10/23  1041     History  Chief Complaint  Patient presents with   Ankle Pain    Anna Bright is a 27 y.o. female.  Patient is a 27 year old female who presents with pain in her left ankle.  She states she was walking down a hill last night and twisted it.  She has had ongoing pain since that time.  She denies any other injuries from the fall.  She did not hit her head.  No loss of consciousness.  No neck or back pain.  She has not taken anything today for the pain.       Home Medications Prior to Admission medications   Medication Sig Start Date End Date Taking? Authorizing Provider  ibuprofen (ADVIL) 800 MG tablet Take 1 tablet (800 mg total) by mouth 3 (three) times daily. 01/29/20   Arby Barrette, MD  ondansetron (ZOFRAN ODT) 4 MG disintegrating tablet Take 1 tablet (4 mg total) by mouth every 8 (eight) hours as needed for nausea or vomiting. 06/29/18   Little, Ambrose Finland, MD  ondansetron (ZOFRAN ODT) 4 MG disintegrating tablet Take 1 tablet (4 mg total) by mouth every 4 (four) hours as needed for nausea or vomiting. 01/29/20   Arby Barrette, MD  promethazine-dextromethorphan (PROMETHAZINE-DM) 6.25-15 MG/5ML syrup Take 5 mLs by mouth 4 (four) times daily as needed. 02/09/20   Eulis Foster, FNP      Allergies    Patient has no known allergies.    Review of Systems   Review of Systems  Constitutional:  Negative for fever.  Gastrointestinal:  Negative for nausea and vomiting.  Musculoskeletal:  Positive for arthralgias and joint swelling. Negative for back pain and neck pain.  Skin:  Negative for wound.  Neurological:  Negative for weakness, numbness and headaches.    Physical Exam Updated Vital Signs BP (!) 118/95   Pulse 81   Temp 98.8 F (37.1 C) (Oral)   Resp 18   Ht 5' (1.524 m)   Wt 73.5 kg   LMP 01/18/2023 (Exact Date)   SpO2 99%   BMI  31.64 kg/m  Physical Exam Constitutional:      Appearance: She is well-developed.  HENT:     Head: Normocephalic and atraumatic.  Cardiovascular:     Rate and Rhythm: Normal rate.  Pulmonary:     Effort: Pulmonary effort is normal.  Musculoskeletal:        General: Tenderness present.     Cervical back: Normal range of motion and neck supple.     Comments: Positive tenderness over the lateral malleolus of the left ankle.  There is no tenderness to the foot.  No pain in the proximal fibula.  There is mild swelling over the lateral malleolus.  Pedal pulses are intact.  No wounds.  Sensation and motor function is intact.  Skin:    General: Skin is warm and dry.  Neurological:     Mental Status: She is alert and oriented to person, place, and time.     ED Results / Procedures / Treatments   Labs (all labs ordered are listed, but only abnormal results are displayed) Labs Reviewed - No data to display  EKG None  Radiology DG Ankle Complete Left  Result Date: 02/10/2023 CLINICAL DATA:  Twisted ankle last evening.  Ankle pain. EXAM: LEFT ANKLE COMPLETE - 3 VIEW COMPARISON:  None Available. FINDINGS:  There is no evidence of fracture, dislocation, or joint effusion. There is no evidence of arthropathy or other focal bone abnormality. Soft tissues are unremarkable. IMPRESSION: Negative left ankle radiographs. Electronically Signed   By: Marin Roberts M.D.   On: 02/10/2023 11:41    Procedures Procedures    Medications Ordered in ED Medications  ibuprofen (ADVIL) tablet 600 mg (600 mg Oral Given 02/10/23 1121)    ED Course/ Medical Decision Making/ A&P                                 Medical Decision Making Amount and/or Complexity of Data Reviewed Radiology: ordered.   Patient presents with left ankle pain.  X-rays were obtained which showed no evidence of fracture.  This was interpreted by me and confirmed by the radiologist.  She likely has a sprain.  I do not  appreciate any gross ligament instability.  She was advised on symptomatic care.  She was placed in a Velcro ankle splint.  She was advised in ice and elevation.  She was given a referral to follow-up with orthopedics if her symptoms are not improving over the next week.  Return precautions were given.  Final Clinical Impression(s) / ED Diagnoses Final diagnoses:  Sprain of left ankle, unspecified ligament, initial encounter    Rx / DC Orders ED Discharge Orders     None         Rolan Bucco, MD 02/10/23 1149

## 2023-02-10 NOTE — Discharge Instructions (Signed)
Follow-up with the orthopedist as discussed if your symptoms are not improving.  Return to the emergency room if you have any worsening symptoms.

## 2023-08-07 ENCOUNTER — Encounter (HOSPITAL_BASED_OUTPATIENT_CLINIC_OR_DEPARTMENT_OTHER): Payer: Self-pay

## 2023-08-07 ENCOUNTER — Other Ambulatory Visit: Payer: Self-pay

## 2023-08-07 ENCOUNTER — Emergency Department (HOSPITAL_BASED_OUTPATIENT_CLINIC_OR_DEPARTMENT_OTHER)
Admission: EM | Admit: 2023-08-07 | Discharge: 2023-08-07 | Disposition: A | Discharge status: Home / Self Care | Payer: Medicaid Other | Attending: Emergency Medicine | Admitting: Emergency Medicine

## 2023-08-07 DIAGNOSIS — J020 Streptococcal pharyngitis: Secondary | ICD-10-CM | POA: Insufficient documentation

## 2023-08-07 DIAGNOSIS — J029 Acute pharyngitis, unspecified: Secondary | ICD-10-CM | POA: Diagnosis present

## 2023-08-07 LAB — RESP PANEL BY RT-PCR (RSV, FLU A&B, COVID)  RVPGX2
Influenza A by PCR: NEGATIVE
Influenza B by PCR: NEGATIVE
Resp Syncytial Virus by PCR: NEGATIVE
SARS Coronavirus 2 by RT PCR: NEGATIVE

## 2023-08-07 LAB — GROUP A STREP BY PCR: Group A Strep by PCR: DETECTED — AB

## 2023-08-07 MED ORDER — DEXAMETHASONE SODIUM PHOSPHATE 10 MG/ML IJ SOLN
10.0000 mg | Freq: Once | INTRAMUSCULAR | Status: AC
  Administered 2023-08-07: 10 mg via INTRAMUSCULAR
  Filled 2023-08-07: qty 1

## 2023-08-07 MED ORDER — PENICILLIN G BENZATHINE 1200000 UNIT/2ML IM SUSY
1.2000 10*6.[IU] | PREFILLED_SYRINGE | Freq: Once | INTRAMUSCULAR | Status: AC
  Administered 2023-08-07: 1.2 10*6.[IU] via INTRAMUSCULAR
  Filled 2023-08-07: qty 2

## 2023-08-07 MED ORDER — ACETAMINOPHEN 325 MG PO TABS
650.0000 mg | ORAL_TABLET | Freq: Once | ORAL | Status: AC | PRN
  Administered 2023-08-07: 650 mg via ORAL
  Filled 2023-08-07: qty 2

## 2023-08-07 NOTE — ED Triage Notes (Signed)
 Pt reports body aches, sore throat and headache since yesterday. Fever 101

## 2023-08-07 NOTE — Discharge Instructions (Signed)
 As we discussed, your strep swab was positive.  Given that strep throat is a bacterial illness, we have given you a shot of antibiotics to treat this. Given that you decided to have the injection of antibiotics, no further treatment is required. You should start to feel better in the next 24-48 hours. We have also given you steroids that should work over the next day or so for swelling.  In the interim while you are recovering from this infection, I recommend that you take Tylenol/ibuprofen as needed for fevers.  Please use acetaminophen (Tylenol) or ibuprofen (Advil, Motrin) for pain.  You may use 800 mg ibuprofen every 6 hours or 1000 mg of acetaminophen every 6 hours.  You may choose to alternate between the two, this would be most effective. Do not exceed 4000 mg of acetaminophen within 24 hours.  Do not exceed 3200 mg ibuprofen within 24 hours.  Please be sure to stay well-hydrated specifically with fluids with electrolytes such as Gatorade or Pedialyte.  You can also get Cepacol throat lozenges over-the-counter at your local grocery store or pharmacy to help with the pain in your throat.  Follow-up closely with your primary care doctor as needed.  Return if development of any new or worsening symptoms.

## 2023-08-07 NOTE — ED Provider Notes (Signed)
 [place] [facility] AT [facility] Provider Note   CSN: [id number] Arrival date & time: 08/07/23  1056     History  Chief Complaint  Patient presents with   Generalized Body Aches   Sore Throat    Anna Bright is a 28 y.o. female.  Patient with no pertinent past medical history presents today with complaints of sore throat and body aches. She states that same began yesterday afternoon and has been persistent since then. She does note fever at home at 101. She is tolerating secretions. Denies neck stiffness. 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 Denies cough or congestion. No nausea or vomiting.  The history is provided by the patient. No language interpreter was used.  Sore Throat       Home Medications Prior to Admission medications   Medication Sig Start Date End Date Taking? Authorizing Provider  ibuprofen (ADVIL) 800 MG tablet Take 1 tablet (800 mg total) by mouth 3 (three) times daily. 01/29/20   Arby Barrette, MD  ondansetron (ZOFRAN ODT) 4 MG disintegrating tablet Take 1 tablet (4 mg total) by mouth every 8 (eight) hours as needed for nausea or vomiting. 06/29/18   Little, Ambrose Finland, MD  ondansetron (ZOFRAN ODT) 4 MG disintegrating tablet Take 1 tablet (4 mg total) by mouth every 4 (four) hours as needed for nausea or vomiting. 01/29/20   Arby Barrette, MD  promethazine-dextromethorphan (PROMETHAZINE-DM) 6.25-15 MG/5ML syrup Take 5 mLs by mouth 4 (four) times daily as needed. 02/09/20   Eulis Foster, FNP      Allergies    Patient has no known allergies.    Review of Systems   Review of Systems  HENT:  Positive for sore throat.   All other systems reviewed and are negative.   Physical Exam Updated Vital Signs BP 119/74 (BP Location: Left Arm)   Pulse (!) 104   Temp 99.3 F (37.4 C) (Oral)   Resp 18   LMP 08/02/2023   SpO2 100%  Physical Exam Vitals and nursing note reviewed.   Constitutional:      General: She is not in acute distress.    Appearance: Normal appearance. She is normal weight. She is not ill-appearing, toxic-appearing or diaphoretic.  HENT:     Head: Normocephalic and atraumatic.     Mouth/Throat:     Mouth: Mucous membranes are moist.     Pharynx: Uvula midline. No uvula swelling.     Tonsils: Tonsillar exudate present. No tonsillar abscesses. 2+ on the right. 2+ on the left.  Neck:     Comments: No meningismus Cardiovascular:     Rate and Rhythm: Normal rate and regular rhythm.     Heart sounds: Normal heart sounds.  Pulmonary:     Effort: Pulmonary effort is normal. No respiratory distress.     Breath sounds: Normal breath sounds.  Abdominal:     Palpations: Abdomen is soft.     Tenderness: There is no abdominal tenderness.  Musculoskeletal:        General: Normal range of motion.     Cervical back: Normal range of motion and neck supple.  Skin:    General: Skin is warm and dry.  Neurological:     General: No focal deficit present.     Mental Status: She is alert.  Psychiatric:        Mood and Affect: Mood normal.  Behavior: Behavior normal.     ED Results / Procedures / Treatments   Labs (all labs ordered are listed, but only abnormal results are displayed) Labs Reviewed  GROUP A STREP BY PCR - Abnormal; Notable for the following components:      Result Value   Group A Strep by PCR DETECTED (*)    All other components within normal limits  RESP PANEL BY RT-PCR (RSV, FLU A&B, COVID)  RVPGX2    EKG None  Radiology No results found.  Procedures Procedures    Medications Ordered in ED Medications  penicillin g benzathine (BICILLIN LA) 1200000 UNIT/2ML injection 1.2 Million Units (has no administration in time range)  dexamethasone (DECADRON) injection 10 mg (has no administration in time range)  acetaminophen (TYLENOL) tablet 650 mg (650 mg Oral Given 08/07/23 1134)    ED Course/ Medical Decision Making/  A&P                                 Medical Decision Making Risk OTC drugs. Prescription drug management.   Patient presents today with complaints of sore throat since yesterday. She is afebrile, non-toxic appearing, and in no acute distress with reassuring vital signs. Physical exam reveals tonsillar exudate, cervical lymphadenopathy, & dysphagia. Strep swab positive, symptoms consistent with same. Offered oral antibiotics vs penicillin IM, patient prefers to have IM treatment. Also given decadron. Discussed importance of water rehydration. Presentation non concerning for PTA or RPA. No trismus or uvula deviation. Specific return precautions discussed. Pt able to drink water in ED without difficulty with intact air way. Evaluation and diagnostic testing in the emergency department does not suggest an emergent condition requiring admission or immediate intervention beyond what has been performed at this time.  Plan for discharge with close PCP follow-up.  Patient is understanding and amenable with plan, educated on red flag symptoms that would prompt immediate return.  Patient discharged in stable condition.  Final Clinical Impression(s) / ED Diagnoses Final diagnoses:  Strep pharyngitis    Rx / DC Orders ED Discharge Orders     None     An After Visit Summary was printed and given to the patient.     Vear Clock 08/07/23 1402    Ernie Avena, MD 08/07/23 1425

## 2024-05-27 ENCOUNTER — Telehealth: Admitting: Physician Assistant

## 2024-05-27 DIAGNOSIS — Z202 Contact with and (suspected) exposure to infections with a predominantly sexual mode of transmission: Secondary | ICD-10-CM

## 2024-05-27 MED ORDER — METRONIDAZOLE 500 MG PO TABS: 500.0000 mg | ORAL_TABLET | Freq: Two times a day (BID) | ORAL | 0 refills | Status: AC

## 2024-05-27 NOTE — Progress Notes (Signed)
 Virtual Visit Consent   [name], you are scheduled for a virtual visit with a [facility] provider today. Just as with appointments in the office, your consent must be obtained to participate. Your consent will be active for this visit and any virtual visit you may have with one of our providers in the next 365 days. If you have a MyChart account, a copy of this consent can be sent to you electronically.  As this is a virtual visit, video technology does not allow for your provider to perform a traditional examination. This may limit your provider's ability to fully assess your condition. If your provider identifies any concerns that need to be evaluated in person or the need to arrange testing (such as labs, EKG, etc.), we will make arrangements to do so. Although advances in technology are sophisticated, we cannot ensure that it will always work on either your end or our end. If the connection with a video visit is poor, the visit may have to be switched to a telephone visit. With either a video or telephone visit, we are not always able to ensure that we have a secure connection.  By engaging in this virtual visit, you consent to the provision of healthcare and authorize for your insurance to be billed (if applicable) for the services provided during this visit. Depending on your insurance coverage, you may receive a charge related to this service.  I need to obtain your verbal consent now. Are you willing to proceed with your visit today? [name] has provided verbal consent on 05/27/2024 for a virtual visit (video or telephone). Anna Bright, [place]  Date: 05/27/2024 7:37 PM   Virtual Visit via Video Note   I, Anna Bright, connected with  Anna Bright  ([id number], 10-Jan-1996) on 05/27/24 at  7:45 PM EST by a video-enabled telemedicine application and verified that I am speaking with the correct person using two identifiers.  Location: Patient: Virtual Visit Location  Patient: Home Provider: Virtual Visit Location Provider: Home Office   I discussed the limitations of evaluation and management by telemedicine and the availability of in person appointments. The patient expressed understanding and agreed to proceed.    History of Present Illness: Anna Bright is a 28 y.o. who identifies as a female who was assigned female at birth, and is being seen today for exposure to STI. Notes one female partner who she has been active with. Not consistent use of protection. Was called by partner today and informed he tested positive for trich on routine testing and was recommended partners be tested. Notes she would go in to urgent care for evaluation but transportation is a big issue for her right now and she does not want to delay treatment. Denies any symptoms at present. Denies concern for pregnancy. SABRA    HPI: HPI  Problems: There are no active problems to display for this patient.   Allergies: No Known Allergies Medications:  Current Outpatient Medications:    metroNIDAZOLE  (FLAGYL ) 500 MG tablet, Take 1 tablet (500 mg total) by mouth 2 (two) times daily., Disp: 14 tablet, Rfl: 0  Observations/Objective: Patient is well-developed, well-nourished in no acute distress.  Resting comfortably  at home.  Head is normocephalic, atraumatic.  No labored breathing.  Speech is clear and coherent with logical content.  Patient is alert and oriented at baseline.   Assessment and Plan: 1. Exposure to trichomonas (Primary) - metroNIDAZOLE  (FLAGYL ) 500 MG tablet; Take 1 tablet (500 mg total) by  mouth 2 (two) times daily.  Dispense: 14 tablet; Refill: 0  Giving known exposure will provide expedited partner treatment tonight. She is still instructed to be evaluated in person once transportation is arranged for full STI panel as a precaution. Will treat for trich with Flagyl  BID x 7 days. No sexual activity until testing is completed.  Follow Up Instructions: I discussed the  assessment and treatment plan with the patient. The patient was provided an opportunity to ask questions and all were answered. The patient agreed with the plan and demonstrated an understanding of the instructions.  A copy of instructions were sent to the patient via MyChart unless otherwise noted below.    The patient was advised to call back or seek an in-person evaluation if the symptoms worsen or if the condition fails to improve as anticipated.    Anna Velma Lunger, PA-C

## 2024-05-27 NOTE — Patient Instructions (Signed)
 Anna Bright, thank you for joining Anna Velma Lunger, PA-C for today's virtual visit.  While this provider is not your primary care provider (PCP), if your PCP is located in our provider database this encounter information will be shared with them immediately following your visit.   A [facility] MyChart account gives you access to today's visit and all your visits, tests, and labs performed at [facility]  click here if you don't have a [facility] MyChart account or go to [email or web address]  Consent: (Patient) Anna Bright provided verbal consent for this virtual visit at the beginning of the encounter.  Current Medications:  Current Outpatient Medications:    metroNIDAZOLE  (FLAGYL ) 500 MG tablet, Take 1 tablet (500 mg total) by mouth 2 (two) times daily., Disp: 14 tablet, Rfl: 0   Medications ordered in this encounter:  Meds ordered this encounter  Medications   metroNIDAZOLE  (FLAGYL ) 500 MG tablet    Sig: Take 1 tablet (500 mg total) by mouth 2 (two) times daily.    Dispense:  14 tablet    Refill:  0    Supervising Provider:   BLAISE ALEENE KIDD [[id number]]     *If you need refills on other medications prior to your next appointment, please contact your pharmacy*  Follow-Up: Call back or seek an in-person evaluation if the symptoms worsen or if the condition fails to improve as anticipated.  [facility] [phone]  Other Instructions Take the medication as directed. Use link below to help schedule in person evaluation and full STI panel once you get transportation arranged.  Refrain from any sexual activity until testing is completed and any additional treatment courses are given.    Sexually Transmitted Infection Caused by a Parasite (Trichomoniasis): What to Know Trichomoniasis is a sexually transmitted infection (STI). Many people with this infection don't have any symptoms. This means you may not know that you have it, but you can  still spread the infection to other people. If it's not treated, trichomoniasis can last for months or years. This condition is treated with medicine. What are the causes? This disease is caused by a parasite called Trichomonas vaginalis. It's spread through sex. What increases the risk? You're likely to get this infection if: You have unprotected sex. You have sex with someone who has the infection. You have sex with more than one person. You have had trichomoniasis or other STIs in the past. What are the signs or symptoms? In females, symptoms include: Itching, burning, redness, or soreness in the genital area. Discomfort while peeing. Vaginal discharge that's clear, white, gray, or yellow-green. The discharge is foamy and has a fishy odor. In males, symptoms include: Discharge from the penis. Burning after peeing or ejaculation. Itching or discomfort inside the penis. How is this diagnosed? This infection is diagnosed based on tests. Your health care provider will: Do a pee test. Test a small amount of the discharge. In females, the discharge is taken from the vagina or the cervix. In males, the discharge is taken from the urethra. The urethra is the part of the body that drains urine from the bladder. Your provider may test for other STIs, including the human immunodeficiency virus (HIV). How is this treated? You'll be treated with: Antibiotics to kill the parasite, such as metronidazole  or tinidazole. You'll take these medicines by mouth. Over-the-counter medicines or creams. These help to relieve itching or irritation. If you plan to become pregnant or think you may be pregnant, tell your provider  right away. Some medicines that are used to treat the infection should not be taken during pregnancy. Follow these instructions at home: Medicines Take your medicines only as told. Take your antibiotics as told. Do not stop taking them even if you start to feel better. Use creams as  told by your provider. General instructions Do not have sex until you finish your medicine and your symptoms have gone away. Do not have sex if you have symptoms of trichomoniasis or another STI. Talk with your sex partner or partners about any symptoms that either of you may have, as well as any history of STIs. Your sex partner or partners need to be tested and treated. If they have the infection and they're not treated, you can get infected again. For females: Do not douche. Douching may increase your risk for STIs because it removes good bacteria from the vagina. Do not wear tampons while you have the infection. Keep all follow-up visits. You may be tested for the infection again 3 months after treatment. Contact a health care provider if: You still have symptoms after you finish your medicine. You get a rash. You plan to become pregnant or think you may be pregnant. This information is not intended to replace advice given to you by your health care provider. Make sure you discuss any questions you have with your health care provider. Document Revised: 09/27/2023 Document Reviewed: 09/27/2023 Elsevier Patient Education  [employer].   If you have been instructed to have an in-person evaluation today at a local Urgent Care facility, please use the link below. It will take you to a list of all of our available [facility], including address, phone number and hours of operation. Please do not delay care.  [facility]  If you or a family member do not have a primary care provider, use the link below to schedule a visit and establish care. When you choose a [facility] primary care physician or advanced practice provider, you gain a long-term partner in health. Find a Primary Care Provider  Learn more about [facility]'s in-office and virtual care options: [facility] - Get Care Now
